# Patient Record
Sex: Female | Born: 1945
Health system: Southern US, Community
[De-identification: ages and names within clinical notes are randomized; demographics above are authoritative.]

## PROBLEM LIST (undated history)

## (undated) DIAGNOSIS — L650 Telogen effluvium: Secondary | ICD-10-CM

## (undated) DIAGNOSIS — R7301 Impaired fasting glucose: Secondary | ICD-10-CM

## (undated) DIAGNOSIS — I129 Hypertensive chronic kidney disease with stage 1 through stage 4 chronic kidney disease, or unspecified chronic kidney disease: Secondary | ICD-10-CM

## (undated) DIAGNOSIS — R7303 Prediabetes: Secondary | ICD-10-CM

## (undated) DIAGNOSIS — E782 Mixed hyperlipidemia: Secondary | ICD-10-CM

## (undated) DIAGNOSIS — R0600 Dyspnea, unspecified: Secondary | ICD-10-CM

## (undated) DIAGNOSIS — Z87442 Personal history of urinary calculi: Secondary | ICD-10-CM

## (undated) DIAGNOSIS — I5033 Acute on chronic diastolic (congestive) heart failure: Secondary | ICD-10-CM

## (undated) DIAGNOSIS — M1990 Unspecified osteoarthritis, unspecified site: Secondary | ICD-10-CM

## (undated) DIAGNOSIS — L658 Other specified nonscarring hair loss: Secondary | ICD-10-CM

## (undated) DIAGNOSIS — G4730 Sleep apnea, unspecified: Secondary | ICD-10-CM

## (undated) DIAGNOSIS — J302 Other seasonal allergic rhinitis: Secondary | ICD-10-CM

## (undated) DIAGNOSIS — I10 Essential (primary) hypertension: Secondary | ICD-10-CM

## (undated) DIAGNOSIS — R600 Localized edema: Secondary | ICD-10-CM

## (undated) HISTORY — DX: Localized edema: R60.0

## (undated) HISTORY — DX: Telogen effluvium: L65.0

## (undated) HISTORY — DX: Other seasonal allergic rhinitis: J30.2

## (undated) HISTORY — PX: TONSILLECTOMY: SUR1361

## (undated) HISTORY — DX: Essential (primary) hypertension: I10

## (undated) HISTORY — DX: Morbid (severe) obesity due to excess calories: E66.01

## (undated) HISTORY — DX: Other specified nonscarring hair loss: L65.8

## (undated) HISTORY — DX: Impaired fasting glucose: R73.01

## (undated) HISTORY — DX: Mixed hyperlipidemia: E78.2

## (undated) HISTORY — DX: Hypertensive chronic kidney disease with stage 1 through stage 4 chronic kidney disease, or unspecified chronic kidney disease: I12.9

## (undated) HISTORY — DX: Acute on chronic diastolic (congestive) heart failure: I50.33

---

## 2008-05-23 ENCOUNTER — Emergency Department (HOSPITAL_COMMUNITY): Admission: EM | Admit: 2008-05-23 | Discharge: 2008-05-23 | Payer: Self-pay | Admitting: Emergency Medicine

## 2011-06-15 LAB — POCT I-STAT, CHEM 8
Creatinine, Ser: 0.9
Glucose, Bld: 118 — ABNORMAL HIGH
Hemoglobin: 15
Potassium: 4.1
TCO2: 29

## 2011-06-15 LAB — URINALYSIS, ROUTINE W REFLEX MICROSCOPIC
Bilirubin Urine: NEGATIVE
Nitrite: NEGATIVE
Specific Gravity, Urine: 1.02
pH: 6

## 2011-06-15 LAB — CBC
HCT: 44.4
Hemoglobin: 14.8
MCV: 87.6
RBC: 5.07
WBC: 8.2

## 2011-06-15 LAB — DIFFERENTIAL
Eosinophils Absolute: 0
Eosinophils Relative: 0
Lymphs Abs: 1.3
Monocytes Relative: 4

## 2011-06-15 LAB — URINE MICROSCOPIC-ADD ON

## 2011-06-15 LAB — POCT CARDIAC MARKERS: Troponin i, poc: <0.05

## 2012-03-13 DIAGNOSIS — N200 Calculus of kidney: Secondary | ICD-10-CM | POA: Diagnosis not present

## 2012-03-13 DIAGNOSIS — I10 Essential (primary) hypertension: Secondary | ICD-10-CM | POA: Diagnosis not present

## 2012-06-21 DIAGNOSIS — L821 Other seborrheic keratosis: Secondary | ICD-10-CM | POA: Diagnosis not present

## 2012-06-21 DIAGNOSIS — L578 Other skin changes due to chronic exposure to nonionizing radiation: Secondary | ICD-10-CM | POA: Diagnosis not present

## 2012-06-21 DIAGNOSIS — L570 Actinic keratosis: Secondary | ICD-10-CM | POA: Diagnosis not present

## 2012-07-11 DIAGNOSIS — C44111 Basal cell carcinoma of skin of unspecified eyelid, including canthus: Secondary | ICD-10-CM | POA: Diagnosis not present

## 2013-08-22 DIAGNOSIS — D2330 Other benign neoplasm of skin of unspecified part of face: Secondary | ICD-10-CM | POA: Diagnosis not present

## 2013-08-22 DIAGNOSIS — L821 Other seborrheic keratosis: Secondary | ICD-10-CM | POA: Diagnosis not present

## 2014-11-24 DIAGNOSIS — M1811 Unilateral primary osteoarthritis of first carpometacarpal joint, right hand: Secondary | ICD-10-CM | POA: Diagnosis not present

## 2014-11-24 DIAGNOSIS — M79644 Pain in right finger(s): Secondary | ICD-10-CM | POA: Diagnosis not present

## 2015-03-25 LAB — HM PAP SMEAR

## 2015-04-03 LAB — HM DEXA SCAN

## 2015-04-27 DIAGNOSIS — N6001 Solitary cyst of right breast: Secondary | ICD-10-CM | POA: Diagnosis not present

## 2015-04-27 DIAGNOSIS — N63 Unspecified lump in breast: Secondary | ICD-10-CM | POA: Diagnosis not present

## 2015-06-03 DIAGNOSIS — M7050 Other bursitis of knee, unspecified knee: Secondary | ICD-10-CM | POA: Diagnosis not present

## 2016-01-08 DIAGNOSIS — M545 Low back pain: Secondary | ICD-10-CM | POA: Diagnosis not present

## 2016-01-08 DIAGNOSIS — M79661 Pain in right lower leg: Secondary | ICD-10-CM | POA: Diagnosis not present

## 2016-01-08 DIAGNOSIS — M25551 Pain in right hip: Secondary | ICD-10-CM | POA: Diagnosis not present

## 2016-02-16 DIAGNOSIS — M4316 Spondylolisthesis, lumbar region: Secondary | ICD-10-CM | POA: Diagnosis not present

## 2016-02-16 DIAGNOSIS — M545 Low back pain: Secondary | ICD-10-CM | POA: Diagnosis not present

## 2016-02-27 DIAGNOSIS — M4316 Spondylolisthesis, lumbar region: Secondary | ICD-10-CM | POA: Diagnosis not present

## 2016-03-03 DIAGNOSIS — M545 Low back pain: Secondary | ICD-10-CM | POA: Diagnosis not present

## 2016-03-03 DIAGNOSIS — M4316 Spondylolisthesis, lumbar region: Secondary | ICD-10-CM | POA: Diagnosis not present

## 2016-05-04 DIAGNOSIS — R21 Rash and other nonspecific skin eruption: Secondary | ICD-10-CM | POA: Diagnosis not present

## 2016-05-25 DIAGNOSIS — Z0000 Encounter for general adult medical examination without abnormal findings: Secondary | ICD-10-CM | POA: Diagnosis not present

## 2016-05-25 DIAGNOSIS — N959 Unspecified menopausal and perimenopausal disorder: Secondary | ICD-10-CM | POA: Diagnosis not present

## 2016-05-25 DIAGNOSIS — I10 Essential (primary) hypertension: Secondary | ICD-10-CM | POA: Diagnosis not present

## 2016-05-25 DIAGNOSIS — Z1239 Encounter for other screening for malignant neoplasm of breast: Secondary | ICD-10-CM | POA: Diagnosis not present

## 2016-05-25 DIAGNOSIS — Z23 Encounter for immunization: Secondary | ICD-10-CM | POA: Diagnosis not present

## 2016-05-25 DIAGNOSIS — R7301 Impaired fasting glucose: Secondary | ICD-10-CM | POA: Diagnosis not present

## 2016-06-02 DIAGNOSIS — Z1231 Encounter for screening mammogram for malignant neoplasm of breast: Secondary | ICD-10-CM | POA: Diagnosis not present

## 2016-06-08 DIAGNOSIS — R928 Other abnormal and inconclusive findings on diagnostic imaging of breast: Secondary | ICD-10-CM | POA: Diagnosis not present

## 2016-06-08 DIAGNOSIS — N6489 Other specified disorders of breast: Secondary | ICD-10-CM | POA: Diagnosis not present

## 2016-06-10 ENCOUNTER — Other Ambulatory Visit: Payer: Self-pay | Admitting: Family Medicine

## 2016-06-10 DIAGNOSIS — N6320 Unspecified lump in the left breast, unspecified quadrant: Secondary | ICD-10-CM

## 2016-06-10 DIAGNOSIS — R921 Mammographic calcification found on diagnostic imaging of breast: Secondary | ICD-10-CM

## 2016-06-14 ENCOUNTER — Ambulatory Visit
Admission: RE | Admit: 2016-06-14 | Discharge: 2016-06-14 | Disposition: A | Discharge status: Home / Self Care | Payer: Medicare Other | Source: Ambulatory Visit | Attending: Family Medicine | Admitting: Family Medicine

## 2016-06-14 ENCOUNTER — Other Ambulatory Visit: Payer: Self-pay | Admitting: Family Medicine

## 2016-06-14 DIAGNOSIS — N6320 Unspecified lump in the left breast, unspecified quadrant: Secondary | ICD-10-CM

## 2016-06-14 DIAGNOSIS — R921 Mammographic calcification found on diagnostic imaging of breast: Secondary | ICD-10-CM

## 2016-06-14 DIAGNOSIS — N6489 Other specified disorders of breast: Secondary | ICD-10-CM | POA: Diagnosis not present

## 2016-06-14 DIAGNOSIS — R928 Other abnormal and inconclusive findings on diagnostic imaging of breast: Secondary | ICD-10-CM | POA: Diagnosis not present

## 2016-06-14 DIAGNOSIS — R590 Localized enlarged lymph nodes: Secondary | ICD-10-CM | POA: Diagnosis not present

## 2016-12-28 ENCOUNTER — Other Ambulatory Visit: Payer: Self-pay | Admitting: Family Medicine

## 2016-12-28 DIAGNOSIS — N63 Unspecified lump in unspecified breast: Secondary | ICD-10-CM

## 2017-01-02 ENCOUNTER — Other Ambulatory Visit: Payer: Self-pay | Admitting: Family Medicine

## 2017-01-02 ENCOUNTER — Ambulatory Visit
Admission: RE | Admit: 2017-01-02 | Discharge: 2017-01-02 | Disposition: A | Discharge status: Home / Self Care | Payer: Medicare Other | Source: Ambulatory Visit | Attending: Family Medicine | Admitting: Family Medicine

## 2017-01-02 DIAGNOSIS — N63 Unspecified lump in unspecified breast: Secondary | ICD-10-CM

## 2017-01-02 DIAGNOSIS — N6320 Unspecified lump in the left breast, unspecified quadrant: Secondary | ICD-10-CM

## 2017-01-13 DIAGNOSIS — I10 Essential (primary) hypertension: Secondary | ICD-10-CM | POA: Diagnosis not present

## 2017-01-13 DIAGNOSIS — R7301 Impaired fasting glucose: Secondary | ICD-10-CM | POA: Diagnosis not present

## 2017-01-13 DIAGNOSIS — R600 Localized edema: Secondary | ICD-10-CM | POA: Diagnosis not present

## 2017-01-13 DIAGNOSIS — M545 Low back pain: Secondary | ICD-10-CM | POA: Diagnosis not present

## 2017-01-13 DIAGNOSIS — M79604 Pain in right leg: Secondary | ICD-10-CM | POA: Diagnosis not present

## 2017-01-26 DIAGNOSIS — I10 Essential (primary) hypertension: Secondary | ICD-10-CM | POA: Diagnosis not present

## 2017-01-26 DIAGNOSIS — R600 Localized edema: Secondary | ICD-10-CM | POA: Diagnosis not present

## 2017-04-19 DIAGNOSIS — L821 Other seborrheic keratosis: Secondary | ICD-10-CM | POA: Diagnosis not present

## 2017-04-28 ENCOUNTER — Other Ambulatory Visit: Payer: Self-pay | Admitting: Family Medicine

## 2017-04-28 ENCOUNTER — Other Ambulatory Visit: Payer: Self-pay

## 2017-04-28 DIAGNOSIS — Z1231 Encounter for screening mammogram for malignant neoplasm of breast: Secondary | ICD-10-CM

## 2017-05-23 DIAGNOSIS — H52223 Regular astigmatism, bilateral: Secondary | ICD-10-CM | POA: Diagnosis not present

## 2017-05-23 DIAGNOSIS — H25013 Cortical age-related cataract, bilateral: Secondary | ICD-10-CM | POA: Diagnosis not present

## 2017-06-06 DIAGNOSIS — Z0000 Encounter for general adult medical examination without abnormal findings: Secondary | ICD-10-CM | POA: Diagnosis not present

## 2017-06-06 DIAGNOSIS — Z6838 Body mass index (BMI) 38.0-38.9, adult: Secondary | ICD-10-CM | POA: Diagnosis not present

## 2017-06-12 ENCOUNTER — Ambulatory Visit
Admission: RE | Admit: 2017-06-12 | Discharge: 2017-06-12 | Disposition: A | Discharge status: Home / Self Care | Payer: Medicare Other | Source: Ambulatory Visit

## 2017-06-12 DIAGNOSIS — Z1231 Encounter for screening mammogram for malignant neoplasm of breast: Secondary | ICD-10-CM | POA: Diagnosis not present

## 2017-06-20 DIAGNOSIS — Z23 Encounter for immunization: Secondary | ICD-10-CM | POA: Diagnosis not present

## 2017-08-04 DIAGNOSIS — Z1211 Encounter for screening for malignant neoplasm of colon: Secondary | ICD-10-CM | POA: Diagnosis not present

## 2017-08-04 LAB — COLOGUARD: Cologuard: NEGATIVE

## 2017-08-10 LAB — COLOGUARD: Cologuard: NEGATIVE

## 2017-09-18 DIAGNOSIS — M545 Low back pain: Secondary | ICD-10-CM | POA: Diagnosis not present

## 2017-09-18 DIAGNOSIS — R600 Localized edema: Secondary | ICD-10-CM | POA: Diagnosis not present

## 2017-09-18 DIAGNOSIS — R7301 Impaired fasting glucose: Secondary | ICD-10-CM | POA: Diagnosis not present

## 2017-09-18 DIAGNOSIS — I10 Essential (primary) hypertension: Secondary | ICD-10-CM | POA: Diagnosis not present

## 2017-11-20 DIAGNOSIS — H25013 Cortical age-related cataract, bilateral: Secondary | ICD-10-CM | POA: Diagnosis not present

## 2017-12-04 DIAGNOSIS — H2511 Age-related nuclear cataract, right eye: Secondary | ICD-10-CM | POA: Diagnosis not present

## 2017-12-04 DIAGNOSIS — H40033 Anatomical narrow angle, bilateral: Secondary | ICD-10-CM | POA: Diagnosis not present

## 2017-12-04 DIAGNOSIS — H25811 Combined forms of age-related cataract, right eye: Secondary | ICD-10-CM | POA: Diagnosis not present

## 2017-12-04 DIAGNOSIS — Z01818 Encounter for other preprocedural examination: Secondary | ICD-10-CM | POA: Diagnosis not present

## 2017-12-04 DIAGNOSIS — E119 Type 2 diabetes mellitus without complications: Secondary | ICD-10-CM | POA: Diagnosis not present

## 2017-12-13 DIAGNOSIS — H25812 Combined forms of age-related cataract, left eye: Secondary | ICD-10-CM | POA: Diagnosis not present

## 2017-12-13 DIAGNOSIS — H2512 Age-related nuclear cataract, left eye: Secondary | ICD-10-CM | POA: Diagnosis not present

## 2017-12-21 DIAGNOSIS — R7301 Impaired fasting glucose: Secondary | ICD-10-CM | POA: Diagnosis not present

## 2017-12-21 DIAGNOSIS — E6609 Other obesity due to excess calories: Secondary | ICD-10-CM | POA: Diagnosis not present

## 2017-12-21 DIAGNOSIS — L658 Other specified nonscarring hair loss: Secondary | ICD-10-CM | POA: Diagnosis not present

## 2017-12-21 DIAGNOSIS — I10 Essential (primary) hypertension: Secondary | ICD-10-CM | POA: Diagnosis not present

## 2017-12-21 DIAGNOSIS — R600 Localized edema: Secondary | ICD-10-CM | POA: Diagnosis not present

## 2017-12-21 DIAGNOSIS — Z6838 Body mass index (BMI) 38.0-38.9, adult: Secondary | ICD-10-CM | POA: Diagnosis not present

## 2018-01-04 DIAGNOSIS — R944 Abnormal results of kidney function studies: Secondary | ICD-10-CM | POA: Diagnosis not present

## 2018-01-09 DIAGNOSIS — I10 Essential (primary) hypertension: Secondary | ICD-10-CM | POA: Diagnosis not present

## 2018-03-01 DIAGNOSIS — R600 Localized edema: Secondary | ICD-10-CM | POA: Diagnosis not present

## 2018-03-01 DIAGNOSIS — I5033 Acute on chronic diastolic (congestive) heart failure: Secondary | ICD-10-CM | POA: Diagnosis not present

## 2018-03-01 DIAGNOSIS — R0602 Shortness of breath: Secondary | ICD-10-CM | POA: Diagnosis not present

## 2018-03-01 DIAGNOSIS — R7301 Impaired fasting glucose: Secondary | ICD-10-CM | POA: Diagnosis not present

## 2018-03-03 DIAGNOSIS — R808 Other proteinuria: Secondary | ICD-10-CM | POA: Diagnosis not present

## 2018-03-05 DIAGNOSIS — R808 Other proteinuria: Secondary | ICD-10-CM | POA: Diagnosis not present

## 2018-03-08 DIAGNOSIS — I5033 Acute on chronic diastolic (congestive) heart failure: Secondary | ICD-10-CM | POA: Diagnosis not present

## 2018-03-23 DIAGNOSIS — R7301 Impaired fasting glucose: Secondary | ICD-10-CM | POA: Diagnosis not present

## 2018-03-23 DIAGNOSIS — R600 Localized edema: Secondary | ICD-10-CM | POA: Diagnosis not present

## 2018-03-23 DIAGNOSIS — L658 Other specified nonscarring hair loss: Secondary | ICD-10-CM | POA: Diagnosis not present

## 2018-03-23 DIAGNOSIS — Z6838 Body mass index (BMI) 38.0-38.9, adult: Secondary | ICD-10-CM | POA: Diagnosis not present

## 2018-03-23 DIAGNOSIS — I10 Essential (primary) hypertension: Secondary | ICD-10-CM | POA: Diagnosis not present

## 2018-05-07 DIAGNOSIS — N200 Calculus of kidney: Secondary | ICD-10-CM | POA: Diagnosis not present

## 2018-05-07 DIAGNOSIS — I129 Hypertensive chronic kidney disease with stage 1 through stage 4 chronic kidney disease, or unspecified chronic kidney disease: Secondary | ICD-10-CM | POA: Diagnosis not present

## 2018-05-07 DIAGNOSIS — D631 Anemia in chronic kidney disease: Secondary | ICD-10-CM | POA: Diagnosis not present

## 2018-05-07 DIAGNOSIS — N183 Chronic kidney disease, stage 3 (moderate): Secondary | ICD-10-CM | POA: Diagnosis not present

## 2018-05-10 ENCOUNTER — Other Ambulatory Visit: Payer: Self-pay | Admitting: Nephrology

## 2018-05-10 DIAGNOSIS — I129 Hypertensive chronic kidney disease with stage 1 through stage 4 chronic kidney disease, or unspecified chronic kidney disease: Secondary | ICD-10-CM

## 2018-05-10 DIAGNOSIS — N183 Chronic kidney disease, stage 3 unspecified: Secondary | ICD-10-CM

## 2018-05-10 DIAGNOSIS — N200 Calculus of kidney: Secondary | ICD-10-CM

## 2018-05-17 ENCOUNTER — Ambulatory Visit
Admission: RE | Admit: 2018-05-17 | Discharge: 2018-05-17 | Disposition: A | Discharge status: Home / Self Care | Payer: Medicare Other | Source: Ambulatory Visit | Attending: Nephrology | Admitting: Nephrology

## 2018-05-17 DIAGNOSIS — N183 Chronic kidney disease, stage 3 unspecified: Secondary | ICD-10-CM

## 2018-05-17 DIAGNOSIS — N200 Calculus of kidney: Secondary | ICD-10-CM

## 2018-05-17 DIAGNOSIS — I129 Hypertensive chronic kidney disease with stage 1 through stage 4 chronic kidney disease, or unspecified chronic kidney disease: Secondary | ICD-10-CM

## 2018-06-26 DIAGNOSIS — N183 Chronic kidney disease, stage 3 (moderate): Secondary | ICD-10-CM | POA: Diagnosis not present

## 2018-06-27 DIAGNOSIS — Z23 Encounter for immunization: Secondary | ICD-10-CM | POA: Diagnosis not present

## 2018-07-03 DIAGNOSIS — R809 Proteinuria, unspecified: Secondary | ICD-10-CM | POA: Diagnosis not present

## 2018-07-03 DIAGNOSIS — N183 Chronic kidney disease, stage 3 (moderate): Secondary | ICD-10-CM | POA: Diagnosis not present

## 2018-07-03 DIAGNOSIS — I129 Hypertensive chronic kidney disease with stage 1 through stage 4 chronic kidney disease, or unspecified chronic kidney disease: Secondary | ICD-10-CM | POA: Diagnosis not present

## 2018-07-16 DIAGNOSIS — H4303 Vitreous prolapse, bilateral: Secondary | ICD-10-CM | POA: Diagnosis not present

## 2018-08-01 ENCOUNTER — Other Ambulatory Visit: Payer: Self-pay | Admitting: Family Medicine

## 2018-08-01 DIAGNOSIS — Z1231 Encounter for screening mammogram for malignant neoplasm of breast: Secondary | ICD-10-CM

## 2018-08-07 DIAGNOSIS — N183 Chronic kidney disease, stage 3 (moderate): Secondary | ICD-10-CM | POA: Diagnosis not present

## 2018-09-17 ENCOUNTER — Ambulatory Visit
Admission: RE | Admit: 2018-09-17 | Discharge: 2018-09-17 | Disposition: A | Discharge status: Home / Self Care | Payer: Medicare Other | Source: Ambulatory Visit | Attending: Family Medicine | Admitting: Family Medicine

## 2018-09-17 DIAGNOSIS — Z1231 Encounter for screening mammogram for malignant neoplasm of breast: Secondary | ICD-10-CM | POA: Diagnosis not present

## 2018-09-17 LAB — HM MAMMOGRAPHY: HM Mammogram: ABNORMAL — AB (ref 0–4)

## 2018-09-19 ENCOUNTER — Other Ambulatory Visit: Payer: Self-pay | Admitting: Family Medicine

## 2018-09-19 DIAGNOSIS — R928 Other abnormal and inconclusive findings on diagnostic imaging of breast: Secondary | ICD-10-CM

## 2018-09-19 DIAGNOSIS — N183 Chronic kidney disease, stage 3 (moderate): Secondary | ICD-10-CM | POA: Diagnosis not present

## 2018-09-19 DIAGNOSIS — R600 Localized edema: Secondary | ICD-10-CM | POA: Diagnosis not present

## 2018-09-19 DIAGNOSIS — I129 Hypertensive chronic kidney disease with stage 1 through stage 4 chronic kidney disease, or unspecified chronic kidney disease: Secondary | ICD-10-CM | POA: Diagnosis not present

## 2018-09-19 DIAGNOSIS — E876 Hypokalemia: Secondary | ICD-10-CM | POA: Diagnosis not present

## 2018-09-25 ENCOUNTER — Ambulatory Visit
Admission: RE | Admit: 2018-09-25 | Discharge: 2018-09-25 | Disposition: A | Discharge status: Home / Self Care | Payer: Medicare Other | Source: Ambulatory Visit | Attending: Family Medicine | Admitting: Family Medicine

## 2018-09-25 DIAGNOSIS — R922 Inconclusive mammogram: Secondary | ICD-10-CM | POA: Diagnosis not present

## 2018-09-25 DIAGNOSIS — R928 Other abnormal and inconclusive findings on diagnostic imaging of breast: Secondary | ICD-10-CM

## 2018-09-25 DIAGNOSIS — N6002 Solitary cyst of left breast: Secondary | ICD-10-CM | POA: Diagnosis not present

## 2018-11-21 DIAGNOSIS — Z0001 Encounter for general adult medical examination with abnormal findings: Secondary | ICD-10-CM | POA: Diagnosis not present

## 2018-11-21 DIAGNOSIS — R7301 Impaired fasting glucose: Secondary | ICD-10-CM | POA: Diagnosis not present

## 2018-11-21 DIAGNOSIS — Z6841 Body Mass Index (BMI) 40.0 and over, adult: Secondary | ICD-10-CM | POA: Diagnosis not present

## 2018-11-21 DIAGNOSIS — I129 Hypertensive chronic kidney disease with stage 1 through stage 4 chronic kidney disease, or unspecified chronic kidney disease: Secondary | ICD-10-CM | POA: Diagnosis not present

## 2018-11-21 DIAGNOSIS — E782 Mixed hyperlipidemia: Secondary | ICD-10-CM | POA: Diagnosis not present

## 2018-11-21 DIAGNOSIS — D485 Neoplasm of uncertain behavior of skin: Secondary | ICD-10-CM | POA: Diagnosis not present

## 2018-12-05 DIAGNOSIS — C44319 Basal cell carcinoma of skin of other parts of face: Secondary | ICD-10-CM | POA: Diagnosis not present

## 2018-12-05 DIAGNOSIS — L820 Inflamed seborrheic keratosis: Secondary | ICD-10-CM | POA: Diagnosis not present

## 2018-12-05 DIAGNOSIS — L821 Other seborrheic keratosis: Secondary | ICD-10-CM | POA: Diagnosis not present

## 2019-01-14 DIAGNOSIS — N183 Chronic kidney disease, stage 3 (moderate): Secondary | ICD-10-CM | POA: Diagnosis not present

## 2019-01-21 DIAGNOSIS — I129 Hypertensive chronic kidney disease with stage 1 through stage 4 chronic kidney disease, or unspecified chronic kidney disease: Secondary | ICD-10-CM | POA: Diagnosis not present

## 2019-01-21 DIAGNOSIS — R809 Proteinuria, unspecified: Secondary | ICD-10-CM | POA: Diagnosis not present

## 2019-01-21 DIAGNOSIS — N183 Chronic kidney disease, stage 3 (moderate): Secondary | ICD-10-CM | POA: Diagnosis not present

## 2019-01-21 DIAGNOSIS — E876 Hypokalemia: Secondary | ICD-10-CM | POA: Diagnosis not present

## 2019-01-21 DIAGNOSIS — N200 Calculus of kidney: Secondary | ICD-10-CM | POA: Diagnosis not present

## 2019-01-21 DIAGNOSIS — R600 Localized edema: Secondary | ICD-10-CM | POA: Diagnosis not present

## 2019-03-02 IMAGING — MG DIGITAL SCREENING BILATERAL MAMMOGRAM WITH TOMO AND CAD
8 series · 8 of 24 positions shown · non-contrast
Comparison: Previous exam(s).

CLINICAL DATA: Screening.

EXAM:
DIGITAL SCREENING BILATERAL MAMMOGRAM WITH TOMO AND CAD

[R CC synth-2D]
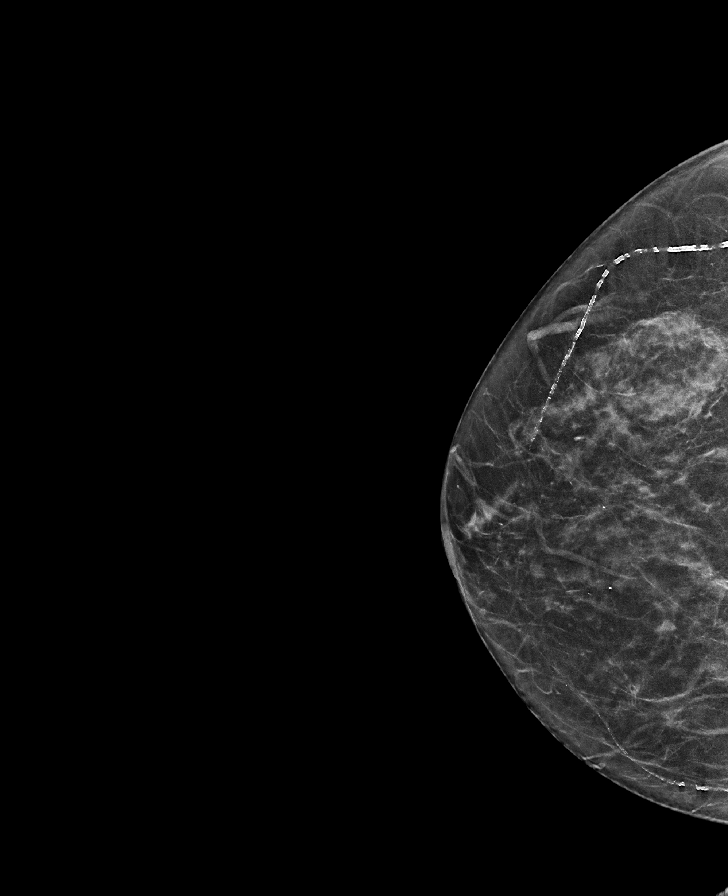

[L CC synth-2D]
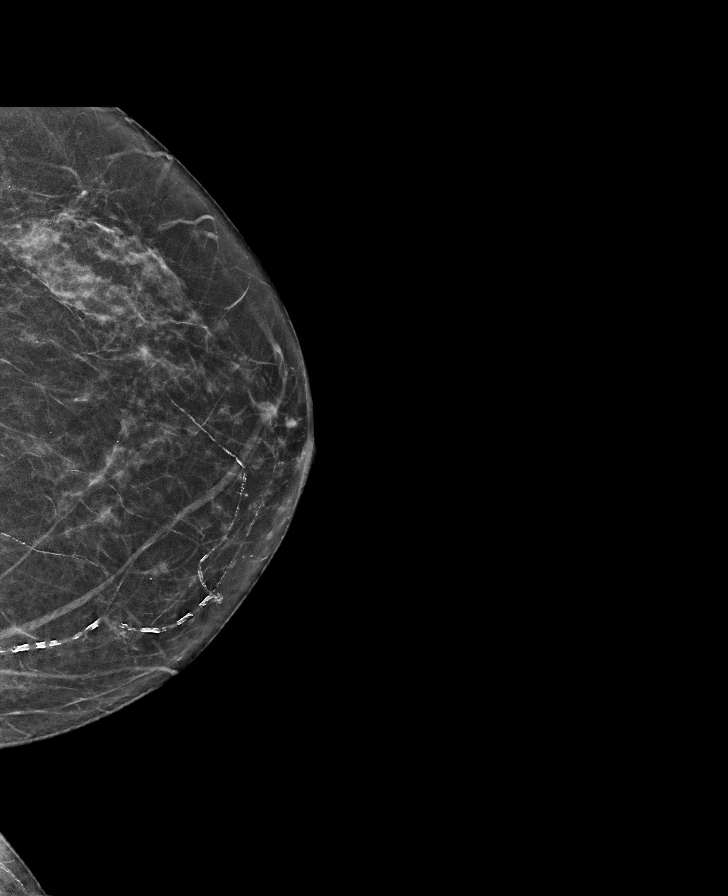

[L MLO synth-2D]
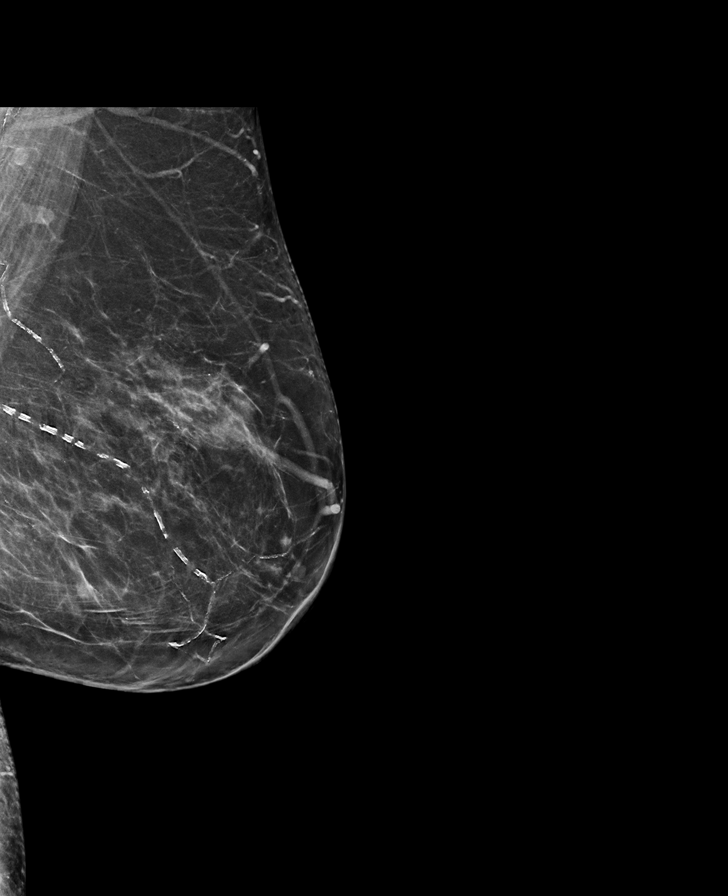

[R MLO synth-2D]
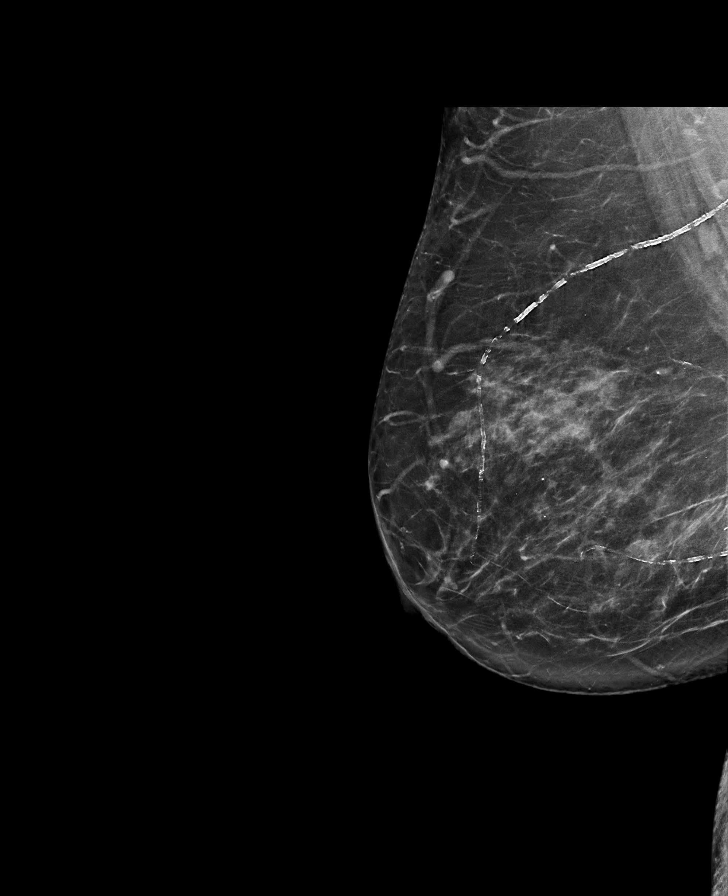

[R MLO tomo · tomo slice 39/77.0]
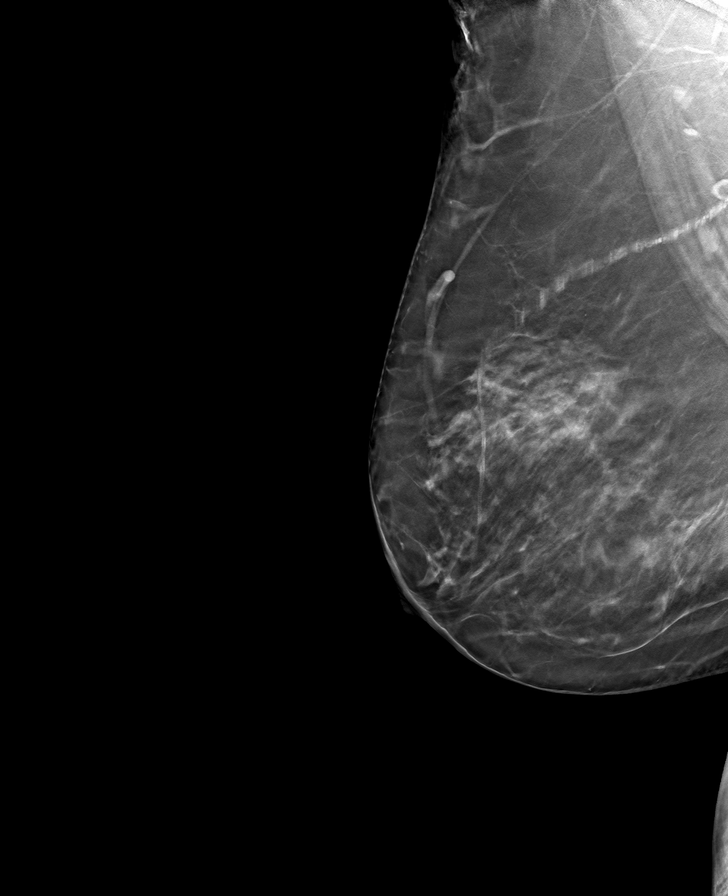

[L MLO tomo · tomo slice 39/78.0]
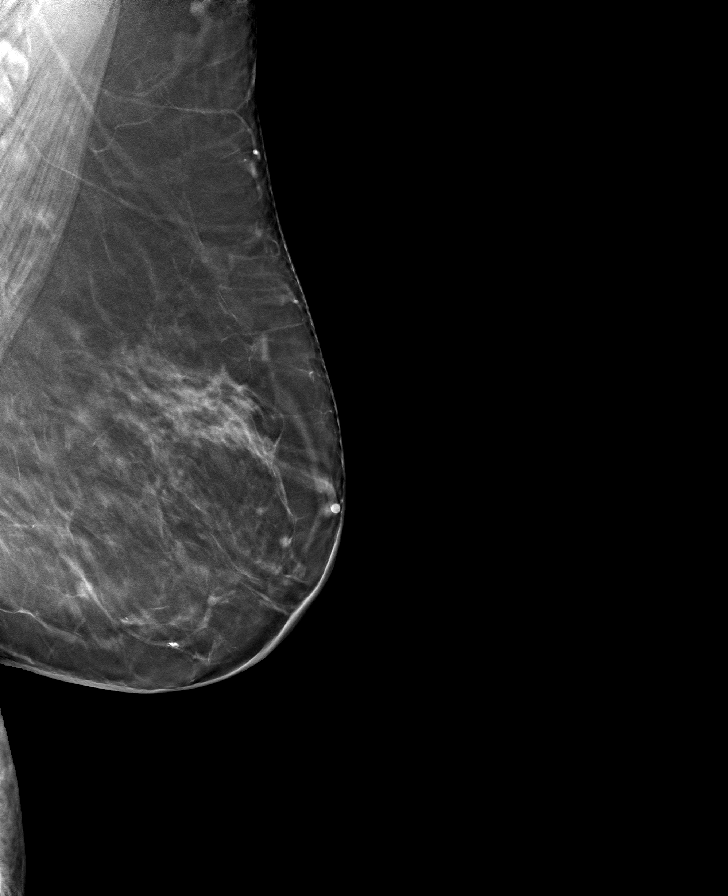

[R CC tomo · tomo slice 31/61.0]
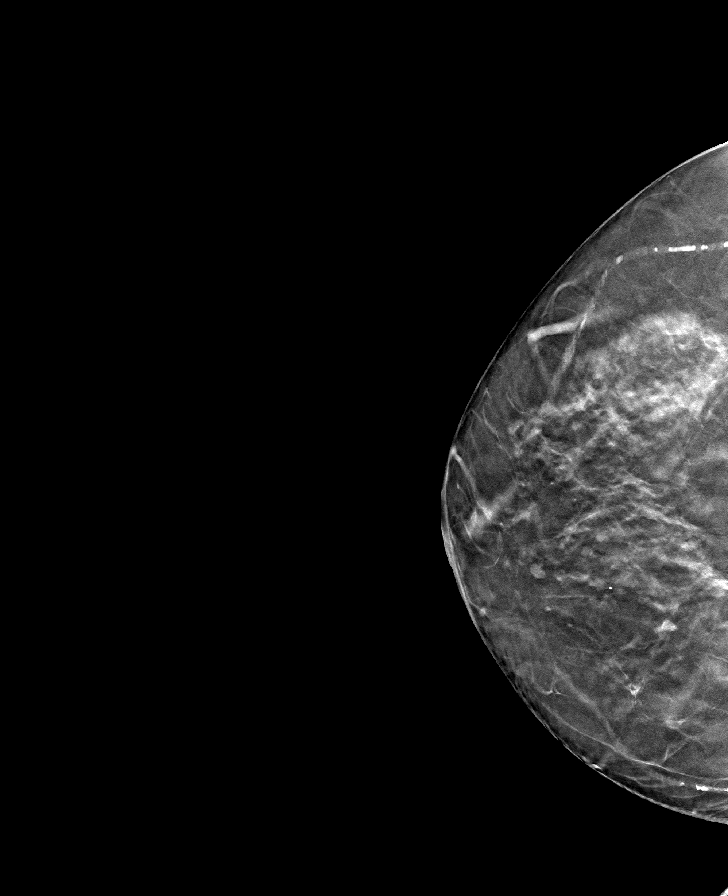

[L CC tomo · tomo slice 31/62.0]
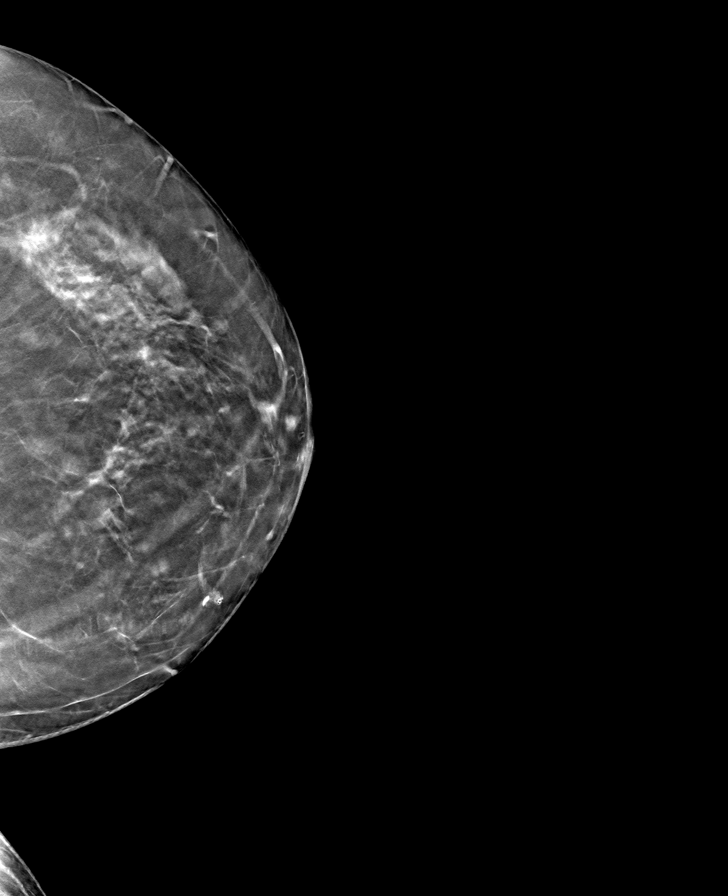

[8 of 24 positions shown; findings below may reference images not displayed]

ACR Breast Density Category c: The breast tissue is heterogeneously
dense, which may obscure small masses.
FINDINGS: In the right breast, a possible mass warrants further evaluation. In
the left breast, no findings suspicious for malignancy. Images were
processed with CAD.
IMPRESSION: Further evaluation is suggested for possible mass in the right
breast.

RECOMMENDATION:
Diagnostic mammogram and possibly ultrasound of the right breast.
(Code:[id number])

The patient will be contacted regarding the findings, and additional
imaging will be scheduled.

BI-RADS CATEGORY  0: Incomplete. Need additional imaging evaluation
and/or prior mammograms for comparison.

## 2019-04-17 DIAGNOSIS — I129 Hypertensive chronic kidney disease with stage 1 through stage 4 chronic kidney disease, or unspecified chronic kidney disease: Secondary | ICD-10-CM | POA: Diagnosis not present

## 2019-04-17 DIAGNOSIS — N183 Chronic kidney disease, stage 3 (moderate): Secondary | ICD-10-CM | POA: Diagnosis not present

## 2019-04-17 DIAGNOSIS — Z0120 Encounter for dental examination and cleaning without abnormal findings: Secondary | ICD-10-CM | POA: Diagnosis not present

## 2019-04-17 DIAGNOSIS — E876 Hypokalemia: Secondary | ICD-10-CM | POA: Diagnosis not present

## 2019-05-23 DIAGNOSIS — R600 Localized edema: Secondary | ICD-10-CM | POA: Diagnosis not present

## 2019-05-23 DIAGNOSIS — I10 Essential (primary) hypertension: Secondary | ICD-10-CM | POA: Diagnosis not present

## 2019-05-23 DIAGNOSIS — R7301 Impaired fasting glucose: Secondary | ICD-10-CM | POA: Diagnosis not present

## 2019-08-13 DIAGNOSIS — N183 Chronic kidney disease, stage 3 unspecified: Secondary | ICD-10-CM | POA: Diagnosis not present

## 2019-08-13 DIAGNOSIS — I129 Hypertensive chronic kidney disease with stage 1 through stage 4 chronic kidney disease, or unspecified chronic kidney disease: Secondary | ICD-10-CM | POA: Diagnosis not present

## 2019-08-13 DIAGNOSIS — D631 Anemia in chronic kidney disease: Secondary | ICD-10-CM | POA: Diagnosis not present

## 2019-08-13 DIAGNOSIS — E876 Hypokalemia: Secondary | ICD-10-CM | POA: Diagnosis not present

## 2019-08-13 DIAGNOSIS — N189 Chronic kidney disease, unspecified: Secondary | ICD-10-CM | POA: Diagnosis not present

## 2019-08-13 DIAGNOSIS — E559 Vitamin D deficiency, unspecified: Secondary | ICD-10-CM | POA: Diagnosis not present

## 2019-09-10 ENCOUNTER — Other Ambulatory Visit: Payer: Self-pay | Admitting: *Deleted

## 2019-09-10 DIAGNOSIS — Z1231 Encounter for screening mammogram for malignant neoplasm of breast: Secondary | ICD-10-CM

## 2019-10-20 ENCOUNTER — Other Ambulatory Visit: Payer: Self-pay | Admitting: Family Medicine

## 2019-10-21 ENCOUNTER — Other Ambulatory Visit: Payer: Self-pay

## 2019-10-21 ENCOUNTER — Ambulatory Visit
Admission: RE | Admit: 2019-10-21 | Discharge: 2019-10-21 | Disposition: A | Discharge status: Home / Self Care | Payer: Medicare Other | Source: Ambulatory Visit | Attending: Family Medicine | Admitting: Family Medicine

## 2019-10-21 DIAGNOSIS — Z1231 Encounter for screening mammogram for malignant neoplasm of breast: Secondary | ICD-10-CM

## 2019-10-22 DIAGNOSIS — Z0120 Encounter for dental examination and cleaning without abnormal findings: Secondary | ICD-10-CM | POA: Diagnosis not present

## 2019-10-23 ENCOUNTER — Telehealth: Payer: Self-pay

## 2019-11-07 NOTE — Telephone Encounter (Signed)
Made error.

## 2019-11-21 ENCOUNTER — Ambulatory Visit (INDEPENDENT_AMBULATORY_CARE_PROVIDER_SITE_OTHER): Payer: Medicare Other | Admitting: Family Medicine

## 2019-11-21 ENCOUNTER — Encounter: Payer: Self-pay | Admitting: Family Medicine

## 2019-11-21 ENCOUNTER — Other Ambulatory Visit: Payer: Self-pay

## 2019-11-21 VITALS — BP 110/50 | HR 80 | Temp 98.4°F | Resp 16 | Ht 61.0 in | Wt 221.0 lb

## 2019-11-21 DIAGNOSIS — E782 Mixed hyperlipidemia: Secondary | ICD-10-CM

## 2019-11-21 DIAGNOSIS — I10 Essential (primary) hypertension: Secondary | ICD-10-CM | POA: Insufficient documentation

## 2019-11-21 DIAGNOSIS — R7301 Impaired fasting glucose: Secondary | ICD-10-CM

## 2019-11-21 HISTORY — DX: Essential (primary) hypertension: I10

## 2019-11-21 NOTE — Progress Notes (Signed)
Subjective:  Patient ID: Lisa Ortega, female    DOB: 04-Aug-1946  Age: 74 y.o. MRN: [id number]  Chief Complaint  Patient presents with  . Hypertension  . Hyperlipidemia    HPI Patient is a 74 year old white female who presents for follow-up of hypertension, hyperlipidemia, and prediabetes.  She reports not eating healthy.  Nor is she exercising.  She does take her medications correctly.  She tolerates these well.   Social History   Socioeconomic History  . Marital status: Divorced    Spouse name: Not on file  . Number of children: 3  . Years of education: Not on file  . Highest education level: Not on file  Occupational History  . Occupation: Environmental health practitioner  Tobacco Use  . Smoking status: Never Smoker  . Smokeless tobacco: Never Used  Substance and Sexual Activity  . Alcohol use: Never  . Drug use: Never  . Sexual activity: Not on file  Other Topics Concern  . Not on file  Social History Narrative  . Not on file   Social Determinants of Health   Financial Resource Strain:   . Difficulty of Paying Living Expenses:   Food Insecurity:   . Worried About Charity fundraiser in the Last Year:   . Arboriculturist in the Last Year:   Transportation Needs:   . Film/video editor (Medical):   Marland Kitchen Lack of Transportation (Non-Medical):   Physical Activity:   . Days of Exercise per Week:   . Minutes of Exercise per Session:   Stress:   . Feeling of Stress :   Social Connections:   . Frequency of Communication with Friends and Family:   . Frequency of Social Gatherings with Friends and Family:   . Attends Religious Services:   . Active Member of Clubs or Organizations:   . Attends Archivist Meetings:   Marland Kitchen Marital Status:    Past Medical History:  Diagnosis Date  . Acute on chronic diastolic (congestive) heart failure ([place])   . Essential (primary) hypertension   . Hypertensive chronic kidney disease with stage 1 through stage 4 chronic kidney disease, or  unspecified chronic kidney disease   . Impaired fasting glucose   . Localized edema   . Mixed hyperlipidemia   . Morbid (severe) obesity due to excess calories ([place])   . Other seasonal allergic rhinitis   . Other specified nonscarring hair loss   . Telogen effluvium    Family History  Problem Relation Age of Onset  . Transient ischemic attack Mother   . Alzheimer's disease Mother   . Stroke Father   . Throat cancer Father   . Breast cancer Sister   . Pulmonary embolism Brother   . Asthma Other   . Diabetes type II Other   . Hypertension Other   . Hypothyroidism Other   . Heart attack Other   . Migraines Other   . Osteoarthritis Other   . Breast cancer Niece     Review of Systems  Constitutional: Negative for chills, fatigue and fever.  HENT: Negative for congestion, ear pain and sore throat.   Respiratory: Negative for cough and shortness of breath.   Cardiovascular: Negative for chest pain.  Gastrointestinal: Negative for abdominal pain, constipation, diarrhea, nausea and vomiting.  Endocrine: Negative for polydipsia, polyphagia and polyuria.  Genitourinary: Negative for dysuria and urgency.  Musculoskeletal: Positive for arthralgias. Negative for myalgias.  Neurological: Negative for dizziness and headaches.  Psychiatric/Behavioral: Negative for  dysphoric mood. The patient is not nervous/anxious.      Objective:  BP (!) 110/50   Pulse 80   Temp 98.4 F (36.9 C)   Resp 16   Ht 5\' 1"  (1.549 m)   Wt 221 lb (100.2 kg)   BMI 41.76 kg/m   BP/Weight 99991111  Systolic BP [id number]  Diastolic BP 50  Wt. (Lbs) 221  BMI 41.76    Physical Exam Vitals reviewed.  Constitutional:      Appearance: Normal appearance. She is obese.  Neck:     Vascular: No carotid bruit.  Cardiovascular:     Rate and Rhythm: Normal rate and regular rhythm.     Pulses: Normal pulses.     Heart sounds: Normal heart sounds.  Pulmonary:     Effort: Pulmonary effort is normal. No  respiratory distress.     Breath sounds: Normal breath sounds.  Abdominal:     General: Abdomen is flat. Bowel sounds are normal.     Palpations: Abdomen is soft.     Tenderness: There is no abdominal tenderness.  Neurological:     Mental Status: She is alert and oriented to person, place, and time.  Psychiatric:        Mood and Affect: Mood normal.        Behavior: Behavior normal.     Lab Results  Component Value Date   WBC 7.8 11/21/2019   HGB 12.3 11/21/2019   HCT 36.9 11/21/2019   PLT 210 11/21/2019   GLUCOSE 131 (H) 11/21/2019   CHOL 113 11/21/2019   TRIG 68 11/21/2019   HDL 50 11/21/2019   LDLCALC 49 11/21/2019   ALT 13 11/21/2019   AST 23 11/21/2019   NA 144 11/21/2019   K 4.7 11/21/2019   CL 103 11/21/2019   CREATININE 1.25 (H) 11/21/2019   BUN 17 11/21/2019   CO2 26 11/21/2019   TSH 3.270 11/21/2019   HGBA1C 6.3 (H) 11/21/2019      Assessment & Plan:  1. Essential hypertension, benign Well controlled.  The current medical regimen is effective;  continue present plan and medications. Recommend DASH diet.  - CBC with Differential/Platelet - Comprehensive metabolic panel - TSH  2. Mixed hyperlipidemia Fairly well controlled.  The current medical regimen is effective;  continue present plan and medications. - Lipid panel  3. Impaired fasting glucose Recommend diabetic diet and exercise. - Hemoglobin A1c  4. Morbid obesity with bmi 40-44. See above for edcuation.      Follow-up: Return in about 6 months (around 05/23/2020).  AVS was given to patient prior to departure.  Rochel Brome [facility] [phone]

## 2019-11-21 NOTE — Patient Instructions (Signed)
Medical problems stable. No changes to medicines.  Continue to work on eating a healthy diet and exercise.  Recommend 20 lb weight loss by the next visit.   Preventing Type 2 Diabetes Mellitus Type 2 diabetes (type 2 diabetes mellitus) is a long-term (chronic) disease that affects blood sugar (glucose) levels. Normally, a hormone called insulin allows glucose to enter cells in the body. The cells use glucose for energy. In type 2 diabetes, one or both of these problems may be present:  The body does not make enough insulin.  The body does not respond properly to insulin that it makes (insulin resistance). Insulin resistance or lack of insulin causes excess glucose to build up in the blood instead of going into cells. As a result, high blood glucose (hyperglycemia) develops, which can cause many complications. Being overweight or obese and having an inactive (sedentary) lifestyle can increase your risk for diabetes. Type 2 diabetes can be delayed or prevented by making certain nutrition and lifestyle changes. What nutrition changes can be made?   Eat healthy meals and snacks regularly. Keep a healthy snack with you for when you get hungry between meals, such as fruit or a handful of nuts.  Eat lean meats and proteins that are low in saturated fats, such as chicken, fish, egg whites, and beans. Avoid processed meats.  Eat plenty of fruits and vegetables and plenty of grains that have not been processed (whole grains). It is recommended that you eat: ? 1?2 cups of fruit every day. ? 2?3 cups of vegetables every day. ? 6?8 oz of whole grains every day, such as oats, whole wheat, bulgur, brown rice, quinoa, and millet.  Eat low-fat dairy products, such as milk, yogurt, and cheese.  Eat foods that contain healthy fats, such as nuts, avocado, olive oil, and canola oil.  Drink water throughout the day. Avoid drinks that contain added sugar, such as soda or sweet tea.  Follow instructions  from your health care provider about specific eating or drinking restrictions.  Control how much food you eat at a time (portion size). ? Check food labels to find out the serving sizes of foods. ? Use a kitchen scale to weigh amounts of foods.  Saute or steam food instead of frying it. Cook with water or broth instead of oils or butter.  Limit your intake of: ? Salt (sodium). Have no more than 1 tsp (2,400 mg) of sodium a day. If you have heart disease or high blood pressure, have less than ? tsp (1,500 mg) of sodium a day. ? Saturated fat. This is fat that is solid at room temperature, such as butter or fat on meat. What lifestyle changes can be made? Activity   Do moderate-intensity physical activity for at least 30 minutes on at least 5 days of the week, or as much as told by your health care provider.  Ask your health care provider what activities are safe for you. A mix of physical activities may be best, such as walking, swimming, cycling, and strength training.  Try to add physical activity into your day. For example: ? Park in spots that are farther away than usual, so that you walk more. For example, park in a far corner of the parking lot when you go to the office or the grocery store. ? Take a walk during your lunch break. ? Use stairs instead of elevators or escalators. Weight Loss  Lose weight as directed. Your health care provider can determine how much  weight loss is best for you and can help you lose weight safely.  If you are overweight or obese, you may be instructed to lose at least 5?7 % of your body weight. Alcohol and Tobacco   Limit alcohol intake to no more than 1 drink a day for nonpregnant women and 2 drinks a day for men. One drink equals 12 oz of beer, 5 oz of wine, or 1 oz of hard liquor.  Do not use any tobacco products, such as cigarettes, chewing tobacco, and e-cigarettes. If you need help quitting, ask your health care provider. Work With [place] Provider  Have your blood glucose tested regularly, as told by your health care provider.  Discuss your risk factors and how you can reduce your risk for diabetes.  Get screening tests as told by your health care provider. You may have screening tests regularly, especially if you have certain risk factors for type 2 diabetes.  Make an appointment with a diet and nutrition specialist (registered dietitian). A registered dietitian can help you make a healthy eating plan and can help you understand portion sizes and food labels. Why are these changes important?  It is possible to prevent or delay type 2 diabetes and related health problems by making lifestyle and nutrition changes.  It can be difficult to recognize signs of type 2 diabetes. The best way to avoid possible damage to your body is to take actions to prevent the disease before you develop symptoms. What can happen if changes are not made?  Your blood glucose levels may keep increasing. Having high blood glucose for a long time is dangerous. Too much glucose in your blood can damage your blood vessels, heart, kidneys, nerves, and eyes.  You may develop prediabetes or type 2 diabetes. Type 2 diabetes can lead to many chronic health problems and complications, such as: ? Heart disease. ? Stroke. ? Blindness. ? Kidney disease. ? Depression. ? Poor circulation in the feet and legs, which could lead to surgical removal (amputation) in severe cases. Where to find support  Ask your health care provider to recommend a registered dietitian, diabetes educator, or weight loss program.  Look for local or online weight loss groups.  Join a gym, fitness club, or outdoor activity group, such as a walking club. Where to find more information To learn more about diabetes and diabetes prevention, visit:  American Diabetes Association (ADA): www.diabetes.[employer] of Diabetes and Digestive and Kidney Diseases:  FindSpin.nl To learn more about healthy eating, visit:  The U.S. Department of Agriculture Scientist, research (physical sciences)), Choose My Plate: http://wiley-williams.com/  Office of Disease Prevention and Health Promotion (ODPHP), Dietary Guidelines: SurferLive.at Summary  You can reduce your risk for type 2 diabetes by increasing your physical activity, eating healthy foods, and losing weight as directed.  Talk with your health care provider about your risk for type 2 diabetes. Ask about any blood tests or screening tests that you need to have. This information is not intended to replace advice given to you by your health care provider. Make sure you discuss any questions you have with your health care provider. Document Revised: 12/21/2018 Document Reviewed: 10/20/2015 Elsevier Patient Education  [place] DASH stands for "Dietary Approaches to Stop Hypertension." The DASH eating plan is a healthy eating plan that has been shown to reduce high blood pressure (hypertension). It may also reduce your risk for type 2 diabetes, heart disease, and stroke. The DASH eating  plan may also help with weight loss. What are tips for following this plan?  General guidelines  Avoid eating more than 2,300 mg (milligrams) of salt (sodium) a day. If you have hypertension, you may need to reduce your sodium intake to 1,500 mg a day.  Limit alcohol intake to no more than 1 drink a day for nonpregnant women and 2 drinks a day for men. One drink equals 12 oz of beer, 5 oz of wine, or 1 oz of hard liquor.  Work with your health care provider to maintain a healthy body weight or to lose weight. Ask what an ideal weight is for you.  Get at least 30 minutes of exercise that causes your heart to beat faster (aerobic exercise) most days of the week. Activities may include walking, swimming, or biking.  Work with your health care provider or diet and  nutrition specialist (dietitian) to adjust your eating plan to your individual calorie needs. Reading food labels   Check food labels for the amount of sodium per serving. Choose foods with less than 5 percent of the Daily Value of sodium. Generally, foods with less than 300 mg of sodium per serving fit into this eating plan.  To find whole grains, look for the word "whole" as the first word in the ingredient list. Shopping  Buy products labeled as "low-sodium" or "no salt added."  Buy fresh foods. Avoid canned foods and premade or frozen meals. Cooking  Avoid adding salt when cooking. Use salt-free seasonings or herbs instead of table salt or sea salt. Check with your health care provider or pharmacist before using salt substitutes.  Do not fry foods. Cook foods using healthy methods such as baking, boiling, grilling, and broiling instead.  Cook with heart-healthy oils, such as olive, canola, soybean, or sunflower oil. Meal planning  Eat a balanced diet that includes: ? 5 or more servings of fruits and vegetables each day. At each meal, try to fill half of your plate with fruits and vegetables. ? Up to 6-8 servings of whole grains each day. ? Less than 6 oz of lean meat, poultry, or fish each day. A 3-oz serving of meat is about the same size as a deck of cards. One egg equals 1 oz. ? 2 servings of low-fat dairy each day. ? A serving of nuts, seeds, or beans 5 times each week. ? Heart-healthy fats. Healthy fats called Omega-3 fatty acids are found in foods such as flaxseeds and coldwater fish, like sardines, salmon, and mackerel.  Limit how much you eat of the following: ? Canned or prepackaged foods. ? Food that is high in trans fat, such as fried foods. ? Food that is high in saturated fat, such as fatty meat. ? Sweets, desserts, sugary drinks, and other foods with added sugar. ? Full-fat dairy products.  Do not salt foods before eating.  Try to eat at least 2 vegetarian  meals each week.  Eat more home-cooked food and less restaurant, buffet, and fast food.  When eating at a restaurant, ask that your food be prepared with less salt or no salt, if possible. What foods are recommended? The items listed may not be a complete list. Talk with your dietitian about what dietary choices are best for you. Grains Whole-grain or whole-wheat bread. Whole-grain or whole-wheat pasta. Brown rice. [place]. Bulgur. Whole-grain and low-sodium cereals. Pita bread. Low-fat, low-sodium crackers. Whole-wheat flour tortillas. Vegetables Fresh or frozen vegetables (raw, steamed, roasted, or grilled). Low-sodium or reduced-sodium  tomato and vegetable juice. Low-sodium or reduced-sodium tomato sauce and tomato paste. Low-sodium or reduced-sodium canned vegetables. Fruits All fresh, dried, or frozen fruit. Canned fruit in natural juice (without added sugar). Meat and other protein foods Skinless chicken or [place]. Ground chicken or [place]. Pork with fat trimmed off. Fish and seafood. Egg whites. Dried beans, peas, or lentils. Unsalted nuts, nut butters, and seeds. Unsalted canned beans. Lean cuts of beef with fat trimmed off. Low-sodium, lean deli meat. Dairy Low-fat (1%) or fat-free (skim) milk. Fat-free, low-fat, or reduced-fat cheeses. Nonfat, low-sodium ricotta or cottage cheese. Low-fat or nonfat yogurt. Low-fat, low-sodium cheese. Fats and oils Soft margarine without trans fats. Vegetable oil. Low-fat, reduced-fat, or light mayonnaise and salad dressings (reduced-sodium). Canola, safflower, olive, soybean, and sunflower oils. Avocado. Seasoning and other foods Herbs. Spices. Seasoning mixes without salt. Unsalted popcorn and pretzels. Fat-free sweets. What foods are not recommended? The items listed may not be a complete list. Talk with your dietitian about what dietary choices are best for you. Grains Baked goods made with fat, such as croissants, muffins, or some  breads. Dry pasta or rice meal packs. Vegetables Creamed or fried vegetables. Vegetables in a cheese sauce. Regular canned vegetables (not low-sodium or reduced-sodium). Regular canned tomato sauce and paste (not low-sodium or reduced-sodium). Regular tomato and vegetable juice (not low-sodium or reduced-sodium). Angie Fava. Olives. Fruits Canned fruit in a light or heavy syrup. Fried fruit. Fruit in cream or butter sauce. Meat and other protein foods Fatty cuts of meat. Ribs. Fried meat. Berniece Salines. Sausage. Bologna and other processed lunch meats. Salami. Fatback. Hotdogs. Bratwurst. Salted nuts and seeds. Canned beans with added salt. Canned or smoked fish. Whole eggs or egg yolks. Chicken or [place] with skin. Dairy Whole or 2% milk, cream, and half-and-half. Whole or full-fat cream cheese. Whole-fat or sweetened yogurt. Full-fat cheese. Nondairy creamers. Whipped toppings. Processed cheese and cheese spreads. Fats and oils Butter. Stick margarine. Lard. Shortening. Ghee. Bacon fat. Tropical oils, such as coconut, palm kernel, or palm oil. Seasoning and other foods Salted popcorn and pretzels. Onion salt, garlic salt, seasoned salt, table salt, and sea salt. Worcestershire sauce. Tartar sauce. Barbecue sauce. Teriyaki sauce. Soy sauce, including reduced-sodium. Steak sauce. Canned and packaged gravies. Fish sauce. Oyster sauce. Cocktail sauce. Horseradish that you find on the shelf. Ketchup. Mustard. Meat flavorings and tenderizers. Bouillon cubes. Hot sauce and Tabasco sauce. Premade or packaged marinades. Premade or packaged taco seasonings. Relishes. Regular salad dressings. Where to find more information:  National Heart, Lung, and [place]: https://wilson-eaton.com/  American Heart Association: www.heart.org Summary  The DASH eating plan is a healthy eating plan that has been shown to reduce high blood pressure (hypertension). It may also reduce your risk for type 2 diabetes, heart disease, and  stroke.  With the DASH eating plan, you should limit salt (sodium) intake to 2,300 mg a day. If you have hypertension, you may need to reduce your sodium intake to 1,500 mg a day.  When on the DASH eating plan, aim to eat more fresh fruits and vegetables, whole grains, lean proteins, low-fat dairy, and heart-healthy fats.  Work with your health care provider or diet and nutrition specialist (dietitian) to adjust your eating plan to your individual calorie needs. This information is not intended to replace advice given to you by your health care provider. Make sure you discuss any questions you have with your health care provider. Document Revised: 08/11/2017 Document Reviewed: 08/22/2016 Elsevier Patient Education  2020 [employer].

## 2019-11-22 LAB — CBC WITH DIFFERENTIAL/PLATELET
Basophils Absolute: 0.1 10*3/uL (ref 0.0–0.2)
Basos: 1 %
EOS (ABSOLUTE): 0.1 10*3/uL (ref 0.0–0.4)
Eos: 2 %
Hematocrit: 36.9 % (ref 34.0–46.6)
Hemoglobin: 12.3 g/dL (ref 11.1–15.9)
Immature Grans (Abs): 0 10*3/uL (ref 0.0–0.1)
Immature Granulocytes: 0 %
Lymphocytes Absolute: 2.5 10*3/uL (ref 0.7–3.1)
Lymphs: 32 %
MCH: 29.7 pg (ref 26.6–33.0)
MCHC: 33.3 g/dL (ref 31.5–35.7)
MCV: 89 fL (ref 79–97)
Monocytes Absolute: 0.6 10*3/uL (ref 0.1–0.9)
Monocytes: 7 %
Neutrophils Absolute: 4.5 10*3/uL (ref 1.4–7.0)
Neutrophils: 58 %
Platelets: 210 10*3/uL (ref 150–450)
RBC: 4.14 x10E6/uL (ref 3.77–5.28)
RDW: 12.8 % (ref 11.7–15.4)
WBC: 7.8 10*3/uL (ref 3.4–10.8)

## 2019-11-22 LAB — HEMOGLOBIN A1C
Est. average glucose Bld gHb Est-mCnc: 134 mg/dL
Hgb A1c MFr Bld: 6.3 % — ABNORMAL HIGH (ref 4.8–5.6)

## 2019-11-22 LAB — LIPID PANEL
Chol/HDL Ratio: 2.3 ratio (ref 0.0–4.4)
Cholesterol, Total: 113 mg/dL (ref 100–199)
HDL: 50 mg/dL (ref >39)
LDL Chol Calc (NIH): 49 mg/dL (ref 0–99)
Triglycerides: 68 mg/dL (ref 0–149)
VLDL Cholesterol Cal: 14 mg/dL (ref 5–40)

## 2019-11-22 LAB — COMPREHENSIVE METABOLIC PANEL
ALT: 13 IU/L (ref 0–32)
AST: 23 IU/L (ref 0–40)
Albumin/Globulin Ratio: 1.3 (ref 1.2–2.2)
Albumin: 4.1 g/dL (ref 3.7–4.7)
Alkaline Phosphatase: 95 IU/L (ref 39–117)
BUN/Creatinine Ratio: 14 (ref 12–28)
BUN: 17 mg/dL (ref 8–27)
Bilirubin Total: 0.5 mg/dL (ref 0.0–1.2)
CO2: 26 mmol/L (ref 20–29)
Calcium: 9.2 mg/dL (ref 8.7–10.3)
Chloride: 103 mmol/L (ref 96–106)
Creatinine, Ser: 1.25 mg/dL — ABNORMAL HIGH (ref 0.57–1.00)
GFR calc Af Amer: 49 mL/min/{1.73_m2} — ABNORMAL LOW (ref >59)
GFR calc non Af Amer: 42 mL/min/{1.73_m2} — ABNORMAL LOW (ref >59)
Globulin, Total: 3.2 g/dL (ref 1.5–4.5)
Glucose: 131 mg/dL — ABNORMAL HIGH (ref 65–99)
Potassium: 4.7 mmol/L (ref 3.5–5.2)
Sodium: 144 mmol/L (ref 134–144)
Total Protein: 7.3 g/dL (ref 6.0–8.5)

## 2019-11-22 LAB — TSH: TSH: 3.27 u[IU]/mL (ref 0.450–4.500)

## 2019-11-22 LAB — CARDIOVASCULAR RISK ASSESSMENT

## 2019-11-23 DIAGNOSIS — Z6838 Body mass index (BMI) 38.0-38.9, adult: Secondary | ICD-10-CM | POA: Insufficient documentation

## 2019-11-23 DIAGNOSIS — E66813 Obesity, class 3: Secondary | ICD-10-CM

## 2019-11-23 HISTORY — DX: Obesity, class 3: E66.813

## 2019-11-23 HISTORY — DX: Morbid (severe) obesity due to excess calories: E66.01

## 2019-11-23 NOTE — Assessment & Plan Note (Signed)
Work on eating healthy and exercising.

## 2019-12-20 ENCOUNTER — Other Ambulatory Visit: Payer: Self-pay

## 2019-12-20 MED ORDER — ROSUVASTATIN CALCIUM 5 MG PO TABS: 5.0000 mg | ORAL_TABLET | Freq: Every day | ORAL | 0 refills | Status: DC

## 2019-12-29 ENCOUNTER — Other Ambulatory Visit: Payer: Self-pay | Admitting: Family Medicine

## 2020-01-21 DIAGNOSIS — M1712 Unilateral primary osteoarthritis, left knee: Secondary | ICD-10-CM | POA: Diagnosis not present

## 2020-01-21 DIAGNOSIS — M25562 Pain in left knee: Secondary | ICD-10-CM

## 2020-01-21 HISTORY — DX: Pain in left knee: M25.562

## 2020-01-28 DIAGNOSIS — M25562 Pain in left knee: Secondary | ICD-10-CM | POA: Diagnosis not present

## 2020-02-08 DIAGNOSIS — M25562 Pain in left knee: Secondary | ICD-10-CM | POA: Diagnosis not present

## 2020-02-13 DIAGNOSIS — M25562 Pain in left knee: Secondary | ICD-10-CM | POA: Diagnosis not present

## 2020-03-05 DIAGNOSIS — M545 Low back pain: Secondary | ICD-10-CM | POA: Diagnosis not present

## 2020-03-05 DIAGNOSIS — M25562 Pain in left knee: Secondary | ICD-10-CM | POA: Diagnosis not present

## 2020-03-23 ENCOUNTER — Other Ambulatory Visit: Payer: Self-pay

## 2020-03-23 MED ORDER — ROSUVASTATIN CALCIUM 5 MG PO TABS: 5.0000 mg | ORAL_TABLET | Freq: Every day | ORAL | 0 refills | Status: DC

## 2020-04-22 DIAGNOSIS — Z0120 Encounter for dental examination and cleaning without abnormal findings: Secondary | ICD-10-CM | POA: Diagnosis not present

## 2020-04-24 ENCOUNTER — Other Ambulatory Visit: Payer: Self-pay

## 2020-04-24 MED ORDER — LOSARTAN POTASSIUM 25 MG PO TABS: 25.0000 mg | ORAL_TABLET | Freq: Every day | ORAL | 1 refills | Status: DC

## 2020-05-04 DIAGNOSIS — N189 Chronic kidney disease, unspecified: Secondary | ICD-10-CM | POA: Diagnosis not present

## 2020-05-04 DIAGNOSIS — N183 Chronic kidney disease, stage 3 unspecified: Secondary | ICD-10-CM | POA: Diagnosis not present

## 2020-05-11 DIAGNOSIS — N1831 Chronic kidney disease, stage 3a: Secondary | ICD-10-CM | POA: Diagnosis not present

## 2020-05-11 DIAGNOSIS — E876 Hypokalemia: Secondary | ICD-10-CM | POA: Diagnosis not present

## 2020-05-11 DIAGNOSIS — I129 Hypertensive chronic kidney disease with stage 1 through stage 4 chronic kidney disease, or unspecified chronic kidney disease: Secondary | ICD-10-CM | POA: Diagnosis not present

## 2020-05-11 DIAGNOSIS — D631 Anemia in chronic kidney disease: Secondary | ICD-10-CM | POA: Diagnosis not present

## 2020-05-25 ENCOUNTER — Other Ambulatory Visit: Payer: Self-pay

## 2020-05-25 ENCOUNTER — Ambulatory Visit (INDEPENDENT_AMBULATORY_CARE_PROVIDER_SITE_OTHER): Payer: Medicare Other | Admitting: Family Medicine

## 2020-05-25 VITALS — BP 110/70 | HR 70 | Temp 97.3°F | Resp 16 | Ht 61.0 in | Wt 218.0 lb

## 2020-05-25 DIAGNOSIS — N1831 Chronic kidney disease, stage 3a: Secondary | ICD-10-CM

## 2020-05-25 DIAGNOSIS — E782 Mixed hyperlipidemia: Secondary | ICD-10-CM | POA: Diagnosis not present

## 2020-05-25 DIAGNOSIS — R7301 Impaired fasting glucose: Secondary | ICD-10-CM

## 2020-05-25 DIAGNOSIS — I129 Hypertensive chronic kidney disease with stage 1 through stage 4 chronic kidney disease, or unspecified chronic kidney disease: Secondary | ICD-10-CM

## 2020-05-25 NOTE — Progress Notes (Signed)
Subjective:  Patient ID: Lisa Ortega, female    DOB: Jan 15, 1946  Age: 74 y.o. MRN: [id number]  Chief Complaint  Patient presents with  . Hypertension  . Hyperlipidemia    Patient is a 74 year old white female who presents for follow-up of hypertension, hyperlipidemia, and prediabetes.  She reports not eating healthy.  Nor is she exercising.  She does take her medications correctly.  She tolerates these well.  Hyperlipidemia: Takes crestor 5 one daily at night.  Hypertension: Taking losartan, atenolol, and furosemide. Takes potassium chloride. Creatinine stable.  Vitamin D deficiency. Vitamin D level checked by nephrology and it was good. Recommended she remain on it, but not take extra calcium.  Social History   Socioeconomic History  . Marital status: Divorced    Spouse name: Not on file  . Number of children: 3  . Years of education: Not on file  . Highest education level: Not on file  Occupational History  . Occupation: Environmental health practitioner  Tobacco Use  . Smoking status: Never Smoker  . Smokeless tobacco: Never Used  Vaping Use  . Vaping Use: Never used  Substance and Sexual Activity  . Alcohol use: Never  . Drug use: Never  . Sexual activity: Not on file  Other Topics Concern  . Not on file  Social History Narrative  . Not on file   Social Determinants of Health   Financial Resource Strain:   . Difficulty of Paying Living Expenses: Not on file  Food Insecurity:   . Worried About Charity fundraiser in the Last Year: Not on file  . Ran Out of Food in the Last Year: Not on file  Transportation Needs:   . Lack of Transportation (Medical): Not on file  . Lack of Transportation (Non-Medical): Not on file  Physical Activity:   . Days of Exercise per Week: Not on file  . Minutes of Exercise per Session: Not on file  Stress:   . Feeling of Stress : Not on file  Social Connections:   . Frequency of Communication with Friends and Family: Not on file  . Frequency of  Social Gatherings with Friends and Family: Not on file  . Attends Religious Services: Not on file  . Active Member of Clubs or Organizations: Not on file  . Attends Archivist Meetings: Not on file  . Marital Status: Not on file   Past Medical History:  Diagnosis Date  . Acute on chronic diastolic (congestive) heart failure ([place])   . Essential (primary) hypertension   . Hypertensive chronic kidney disease with stage 1 through stage 4 chronic kidney disease, or unspecified chronic kidney disease   . Impaired fasting glucose   . Localized edema   . Mixed hyperlipidemia   . Morbid (severe) obesity due to excess calories ([place])   . Other seasonal allergic rhinitis   . Other specified nonscarring hair loss   . Telogen effluvium    Family History  Problem Relation Age of Onset  . Transient ischemic attack Mother   . Alzheimer's disease Mother   . Stroke Father   . Throat cancer Father   . Breast cancer Sister   . Pulmonary embolism Brother   . Asthma Other   . Diabetes type II Other   . Hypertension Other   . Hypothyroidism Other   . Heart attack Other   . Migraines Other   . Osteoarthritis Other   . Breast cancer Niece     Review of Systems  Constitutional: Negative for chills, fatigue and fever.  HENT: Negative for congestion, ear pain and sore throat.   Respiratory: Negative for cough and shortness of breath.   Cardiovascular: Negative for chest pain.  Gastrointestinal: Negative for abdominal pain, constipation, diarrhea, nausea and vomiting.  Endocrine: Negative for polydipsia, polyphagia and polyuria.  Genitourinary: Negative for dysuria and urgency.  Musculoskeletal: Positive for back pain (does not take anything for it. ). Negative for arthralgias and myalgias.  Neurological: Negative for dizziness and headaches.  Psychiatric/Behavioral: Negative for dysphoric mood. The patient is not nervous/anxious.      Objective:  BP 110/70   Pulse 70   Temp (!)  97.3 F (36.3 C)   Resp 16   Ht 5\' 1"  (1.549 m)   Wt 218 lb (98.9 kg)   BMI 41.19 kg/m   BP/Weight 05/25/2020 10/22/4707  Systolic BP 628 366  Diastolic BP 70 50  Wt. (Lbs) 218 221  BMI 41.19 41.76    Physical Exam Vitals reviewed.  Constitutional:      Appearance: Normal appearance. She is obese.  Neck:     Vascular: No carotid bruit.  Cardiovascular:     Rate and Rhythm: Normal rate and regular rhythm.     Pulses: Normal pulses.     Heart sounds: Normal heart sounds.  Pulmonary:     Effort: Pulmonary effort is normal. No respiratory distress.     Breath sounds: Normal breath sounds.  Abdominal:     General: Abdomen is flat. Bowel sounds are normal.     Palpations: Abdomen is soft.     Tenderness: There is no abdominal tenderness.  Neurological:     Mental Status: She is alert and oriented to person, place, and time.  Psychiatric:        Mood and Affect: Mood normal.        Behavior: Behavior normal.     Lab Results  Component Value Date   WBC 7.8 11/21/2019   HGB 12.3 11/21/2019   HCT 36.9 11/21/2019   PLT 210 11/21/2019   GLUCOSE 131 (H) 11/21/2019   CHOL 113 11/21/2019   TRIG 68 11/21/2019   HDL 50 11/21/2019   LDLCALC 49 11/21/2019   ALT 13 11/21/2019   AST 23 11/21/2019   NA 144 11/21/2019   K 4.7 11/21/2019   CL 103 11/21/2019   CREATININE 1.25 (H) 11/21/2019   BUN 17 11/21/2019   CO2 26 11/21/2019   TSH 3.270 11/21/2019   HGBA1C 6.3 (H) 11/21/2019      Assessment & Plan:  1. Essential hypertension, benign with CKD N18.3a Well controlled.  Keep annual follow up with kidney doctors. The current medical regimen is effective;  continue present plan and medications. Recommend DASH diet.  - CBC with Differential/Platelet - Comprehensive metabolic panel - TSH  2. Mixed hyperlipidemia Well controlled.  The current medical regimen is effective;  continue present plan and medications. - Lipid panel  3. Impaired fasting glucose Recommend  diabetic diet and exercise. - Hemoglobin A1c  4. Morbid obesity with bmi 40-44. See above for edcuation. Recommend continue to work on eating healthy diet and exercise.    I strongly recommend the covid 19 vaccinations (pfizer or moderna.)  Please also continue to practice social distancing, mask wearing, and hand washing. Please call if you develop symptoms of COVID (fever, cough, shortness of breath, congestion, aches, loss of taset/smell, headaches) regardless of vaccination status..  Follow-up: Return in about 6 months (around 11/22/2020) for fasting.  AVS was given to patient prior to departure.  Rochel Brome [facility] [phone]

## 2020-05-25 NOTE — Patient Instructions (Addendum)
I strongly recommend the covid 19 vaccinations (pfizer or moderna.)  Please also continue to practice social distancing, mask wearing, and hand washing.   Please call if you develop symptoms of COVID (fever, cough, shortness of breath, congestion, aches, loss of taset/smell, headaches) regardless of vaccination status, so we can schedule a virtual visit and treat you for covid 19.  Diabetes Mellitus and Nutrition, Adult When you have diabetes (diabetes mellitus), it is very important to have healthy eating habits because your blood sugar (glucose) levels are greatly affected by what you eat and drink. Eating healthy foods in the appropriate amounts, at about the same times every day, can help you:  Control your blood glucose.  Lower your risk of heart disease.  Improve your blood pressure.  Reach or maintain a healthy weight. Every person with diabetes is different, and each person has different needs for a meal plan. Your health care provider may recommend that you work with a diet and nutrition specialist (dietitian) to make a meal plan that is best for you. Your meal plan may vary depending on factors such as:  The calories you need.  The medicines you take.  Your weight.  Your blood glucose, blood pressure, and cholesterol levels.  Your activity level.  Other health conditions you have, such as heart or kidney disease. How do carbohydrates affect me? Carbohydrates, also called carbs, affect your blood glucose level more than any other type of food. Eating carbs naturally raises the amount of glucose in your blood. Carb counting is a method for keeping track of how many carbs you eat. Counting carbs is important to keep your blood glucose at a healthy level, especially if you use insulin or take certain oral diabetes medicines. It is important to know how many carbs you can safely have in each meal. This is different for every person. Your dietitian can help you calculate how many  carbs you should have at each meal and for each snack. Foods that contain carbs include:  Bread, cereal, rice, pasta, and crackers.  Potatoes and corn.  Peas, beans, and lentils.  Milk and yogurt.  Fruit and juice.  Desserts, such as cakes, cookies, ice cream, and candy. How does alcohol affect me? Alcohol can cause a sudden decrease in blood glucose (hypoglycemia), especially if you use insulin or take certain oral diabetes medicines. Hypoglycemia can be a life-threatening condition. Symptoms of hypoglycemia (sleepiness, dizziness, and confusion) are similar to symptoms of having too much alcohol. If your health care provider says that alcohol is safe for you, follow these guidelines:  Limit alcohol intake to no more than 1 drink per day for nonpregnant women and 2 drinks per day for men. One drink equals 12 oz of beer, 5 oz of wine, or 1 oz of hard liquor.  Do not drink on an empty stomach.  Keep yourself hydrated with water, diet soda, or unsweetened iced tea.  Keep in mind that regular soda, juice, and other mixers may contain a lot of sugar and must be counted as carbs. What are tips for following this plan?  Reading food labels  Start by checking the serving size on the "Nutrition Facts" label of packaged foods and drinks. The amount of calories, carbs, fats, and other nutrients listed on the label is based on one serving of the item. Many items contain more than one serving per package.  Check the total grams (g) of carbs in one serving. You can calculate the number of servings of  carbs in one serving by dividing the total carbs by 15. For example, if a food has 30 g of total carbs, it would be equal to 2 servings of carbs.  Check the number of grams (g) of saturated and trans fats in one serving. Choose foods that have low or no amount of these fats.  Check the number of milligrams (mg) of salt (sodium) in one serving. Most people should limit total sodium intake to less  than 2,300 mg per day.  Always check the nutrition information of foods labeled as "low-fat" or "nonfat". These foods may be higher in added sugar or refined carbs and should be avoided.  Talk to your dietitian to identify your daily goals for nutrients listed on the label. Shopping  Avoid buying canned, premade, or processed foods. These foods tend to be high in fat, sodium, and added sugar.  Shop around the outside edge of the grocery store. This includes fresh fruits and vegetables, bulk grains, fresh meats, and fresh dairy. Cooking  Use low-heat cooking methods, such as baking, instead of high-heat cooking methods like deep frying.  Cook using healthy oils, such as olive, canola, or sunflower oil.  Avoid cooking with butter, cream, or high-fat meats. Meal planning  Eat meals and snacks regularly, preferably at the same times every day. Avoid going long periods of time without eating.  Eat foods high in fiber, such as fresh fruits, vegetables, beans, and whole grains. Talk to your dietitian about how many servings of carbs you can eat at each meal.  Eat 4-6 ounces (oz) of lean protein each day, such as lean meat, chicken, fish, eggs, or tofu. One oz of lean protein is equal to: ? 1 oz of meat, chicken, or fish. ? 1 egg. ?  cup of tofu.  Eat some foods each day that contain healthy fats, such as avocado, nuts, seeds, and fish. Lifestyle  Check your blood glucose regularly.  Exercise regularly as told by your health care provider. This may include: ? 150 minutes of moderate-intensity or vigorous-intensity exercise each week. This could be brisk walking, biking, or water aerobics. ? Stretching and doing strength exercises, such as yoga or weightlifting, at least 2 times a week.  Take medicines as told by your health care provider.  Do not use any products that contain nicotine or tobacco, such as cigarettes and e-cigarettes. If you need help quitting, ask your health care  provider.  Work with a Social worker or diabetes educator to identify strategies to manage stress and any emotional and social challenges. Questions to ask a health care provider  Do I need to meet with a diabetes educator?  Do I need to meet with a dietitian?  What number can I call if I have questions?  When are the best times to check my blood glucose? Where to find more information:  American Diabetes Association: diabetes.org  Academy of Nutrition and Dietetics: www.eatright.[employer] of Diabetes and Digestive and Kidney Diseases (NIH): [employer] Summary  A healthy meal plan will help you control your blood glucose and maintain a healthy lifestyle.  Working with a diet and nutrition specialist (dietitian) can help you make a meal plan that is best for you.  Keep in mind that carbohydrates (carbs) and alcohol have immediate effects on your blood glucose levels. It is important to count carbs and to use alcohol carefully. This information is not intended to replace advice given to you by your health care provider. Make sure you  discuss any questions you have with your health care provider. Document Revised: 08/11/2017 Document Reviewed: 10/03/2016 Elsevier Patient Education  2020 [place] Eating Plan DASH stands for "Dietary Approaches to Stop Hypertension." The DASH eating plan is a healthy eating plan that has been shown to reduce high blood pressure (hypertension). It may also reduce your risk for type 2 diabetes, heart disease, and stroke. The DASH eating plan may also help with weight loss. What are tips for following this plan?  General guidelines  Avoid eating more than 2,300 mg (milligrams) of salt (sodium) a day. If you have hypertension, you may need to reduce your sodium intake to 1,500 mg a day.  Limit alcohol intake to no more than 1 drink a day for nonpregnant women and 2 drinks a day for men. One drink equals 12 oz of beer, 5 oz of  wine, or 1 oz of hard liquor.  Work with your health care provider to maintain a healthy body weight or to lose weight. Ask what an ideal weight is for you.  Get at least 30 minutes of exercise that causes your heart to beat faster (aerobic exercise) most days of the week. Activities may include walking, swimming, or biking.  Work with your health care provider or diet and nutrition specialist (dietitian) to adjust your eating plan to your individual calorie needs. Reading food labels   Check food labels for the amount of sodium per serving. Choose foods with less than 5 percent of the Daily Value of sodium. Generally, foods with less than 300 mg of sodium per serving fit into this eating plan.  To find whole grains, look for the word "whole" as the first word in the ingredient list. Shopping  Buy products labeled as "low-sodium" or "no salt added."  Buy fresh foods. Avoid canned foods and premade or frozen meals. Cooking  Avoid adding salt when cooking. Use salt-free seasonings or herbs instead of table salt or sea salt. Check with your health care provider or pharmacist before using salt substitutes.  Do not fry foods. Cook foods using healthy methods such as baking, boiling, grilling, and broiling instead.  Cook with heart-healthy oils, such as olive, canola, soybean, or sunflower oil. Meal planning  Eat a balanced diet that includes: ? 5 or more servings of fruits and vegetables each day. At each meal, try to fill half of your plate with fruits and vegetables. ? Up to 6-8 servings of whole grains each day. ? Less than 6 oz of lean meat, poultry, or fish each day. A 3-oz serving of meat is about the same size as a deck of cards. One egg equals 1 oz. ? 2 servings of low-fat dairy each day. ? A serving of nuts, seeds, or beans 5 times each week. ? Heart-healthy fats. Healthy fats called Omega-3 fatty acids are found in foods such as flaxseeds and coldwater fish, like sardines,  salmon, and mackerel.  Limit how much you eat of the following: ? Canned or prepackaged foods. ? Food that is high in trans fat, such as fried foods. ? Food that is high in saturated fat, such as fatty meat. ? Sweets, desserts, sugary drinks, and other foods with added sugar. ? Full-fat dairy products.  Do not salt foods before eating.  Try to eat at least 2 vegetarian meals each week.  Eat more home-cooked food and less restaurant, buffet, and fast food.  When eating at a restaurant, ask that your food be prepared with less  salt or no salt, if possible. What foods are recommended? The items listed may not be a complete list. Talk with your dietitian about what dietary choices are best for you. Grains Whole-grain or whole-wheat bread. Whole-grain or whole-wheat pasta. Brown rice. [place]. Bulgur. Whole-grain and low-sodium cereals. Pita bread. Low-fat, low-sodium crackers. Whole-wheat flour tortillas. Vegetables Fresh or frozen vegetables (raw, steamed, roasted, or grilled). Low-sodium or reduced-sodium tomato and vegetable juice. Low-sodium or reduced-sodium tomato sauce and tomato paste. Low-sodium or reduced-sodium canned vegetables. Fruits All fresh, dried, or frozen fruit. Canned fruit in natural juice (without added sugar). Meat and other protein foods Skinless chicken or [place]. Ground chicken or [place]. Pork with fat trimmed off. Fish and seafood. Egg whites. Dried beans, peas, or lentils. Unsalted nuts, nut butters, and seeds. Unsalted canned beans. Lean cuts of beef with fat trimmed off. Low-sodium, lean deli meat. Dairy Low-fat (1%) or fat-free (skim) milk. Fat-free, low-fat, or reduced-fat cheeses. Nonfat, low-sodium ricotta or cottage cheese. Low-fat or nonfat yogurt. Low-fat, low-sodium cheese. Fats and oils Soft margarine without trans fats. Vegetable oil. Low-fat, reduced-fat, or light mayonnaise and salad dressings (reduced-sodium). Canola, safflower, olive,  soybean, and sunflower oils. Avocado. Seasoning and other foods Herbs. Spices. Seasoning mixes without salt. Unsalted popcorn and pretzels. Fat-free sweets. What foods are not recommended? The items listed may not be a complete list. Talk with your dietitian about what dietary choices are best for you. Grains Baked goods made with fat, such as croissants, muffins, or some breads. Dry pasta or rice meal packs. Vegetables Creamed or fried vegetables. Vegetables in a cheese sauce. Regular canned vegetables (not low-sodium or reduced-sodium). Regular canned tomato sauce and paste (not low-sodium or reduced-sodium). Regular tomato and vegetable juice (not low-sodium or reduced-sodium). Angie Fava. Olives. Fruits Canned fruit in a light or heavy syrup. Fried fruit. Fruit in cream or butter sauce. Meat and other protein foods Fatty cuts of meat. Ribs. Fried meat. Berniece Salines. Sausage. Bologna and other processed lunch meats. Salami. Fatback. Hotdogs. Bratwurst. Salted nuts and seeds. Canned beans with added salt. Canned or smoked fish. Whole eggs or egg yolks. Chicken or [place] with skin. Dairy Whole or 2% milk, cream, and half-and-half. Whole or full-fat cream cheese. Whole-fat or sweetened yogurt. Full-fat cheese. Nondairy creamers. Whipped toppings. Processed cheese and cheese spreads. Fats and oils Butter. Stick margarine. Lard. Shortening. Ghee. Bacon fat. Tropical oils, such as coconut, palm kernel, or palm oil. Seasoning and other foods Salted popcorn and pretzels. Onion salt, garlic salt, seasoned salt, table salt, and sea salt. Worcestershire sauce. Tartar sauce. Barbecue sauce. Teriyaki sauce. Soy sauce, including reduced-sodium. Steak sauce. Canned and packaged gravies. Fish sauce. Oyster sauce. Cocktail sauce. Horseradish that you find on the shelf. Ketchup. Mustard. Meat flavorings and tenderizers. Bouillon cubes. Hot sauce and Tabasco sauce. Premade or packaged marinades. Premade or packaged taco  seasonings. Relishes. Regular salad dressings. Where to find more information:  National Heart, Lung, and [place]: https://wilson-eaton.com/  American Heart Association: www.heart.org Summary  The DASH eating plan is a healthy eating plan that has been shown to reduce high blood pressure (hypertension). It may also reduce your risk for type 2 diabetes, heart disease, and stroke.  With the DASH eating plan, you should limit salt (sodium) intake to 2,300 mg a day. If you have hypertension, you may need to reduce your sodium intake to 1,500 mg a day.  When on the DASH eating plan, aim to eat more fresh fruits and vegetables, whole grains, lean proteins, low-fat  dairy, and heart-healthy fats.  Work with your health care provider or diet and nutrition specialist (dietitian) to adjust your eating plan to your individual calorie needs. This information is not intended to replace advice given to you by your health care provider. Make sure you discuss any questions you have with your health care provider. Document Revised: 08/11/2017 Document Reviewed: 08/22/2016 Elsevier Patient Education  2020 [employer].

## 2020-05-26 LAB — HEMOGLOBIN A1C
Est. average glucose Bld gHb Est-mCnc: 131 mg/dL
Hgb A1c MFr Bld: 6.2 % — ABNORMAL HIGH (ref 4.8–5.6)

## 2020-05-26 LAB — LIPID PANEL
Chol/HDL Ratio: 2.5 ratio (ref 0.0–4.4)
Cholesterol, Total: 120 mg/dL (ref 100–199)
HDL: 48 mg/dL (ref >39)
LDL Chol Calc (NIH): 56 mg/dL (ref 0–99)
Triglycerides: 77 mg/dL (ref 0–149)
VLDL Cholesterol Cal: 16 mg/dL (ref 5–40)

## 2020-05-26 LAB — CARDIOVASCULAR RISK ASSESSMENT

## 2020-06-18 ENCOUNTER — Other Ambulatory Visit: Payer: Self-pay

## 2020-06-18 MED ORDER — ROSUVASTATIN CALCIUM 5 MG PO TABS: 5.0000 mg | ORAL_TABLET | Freq: Every day | ORAL | 1 refills | Status: DC

## 2020-07-06 ENCOUNTER — Other Ambulatory Visit: Payer: Self-pay | Admitting: Family Medicine

## 2020-07-14 ENCOUNTER — Telehealth: Payer: Self-pay | Admitting: Family Medicine

## 2020-07-14 NOTE — Progress Notes (Signed)
  Chronic Care Management   Outreach Note  07/14/2020 Name: Lisa Ortega MRN: [id number] DOB: [birth year]  Referred by: Rochel Brome, MD Reason for referral : Chronic Care Management   An unsuccessful telephone outreach was attempted today. The patient was referred to the pharmacist for assistance with care management and care coordination.   Follow Up Plan:   Hilario Quarry  [facility]

## 2020-07-20 ENCOUNTER — Telehealth: Payer: Self-pay

## 2020-07-20 NOTE — Telephone Encounter (Signed)
LM with the pts daughter. Pt is due for AWV. Last one was 11/21/2018. The daughter stated she will have [place] call back.

## 2020-08-20 ENCOUNTER — Other Ambulatory Visit: Payer: Self-pay

## 2020-08-20 DIAGNOSIS — Z1211 Encounter for screening for malignant neoplasm of colon: Secondary | ICD-10-CM

## 2020-08-20 NOTE — Progress Notes (Signed)
Cologuard ordered, last done 07/2017 resulted negative.

## 2020-08-21 ENCOUNTER — Encounter: Payer: Self-pay | Admitting: Family Medicine

## 2020-08-22 ENCOUNTER — Encounter: Payer: Self-pay | Admitting: Family Medicine

## 2020-09-18 ENCOUNTER — Other Ambulatory Visit: Payer: Self-pay | Admitting: *Deleted

## 2020-09-18 ENCOUNTER — Other Ambulatory Visit: Payer: Self-pay | Admitting: Family Medicine

## 2020-09-18 DIAGNOSIS — Z1231 Encounter for screening mammogram for malignant neoplasm of breast: Secondary | ICD-10-CM

## 2020-10-26 ENCOUNTER — Other Ambulatory Visit: Payer: Self-pay | Admitting: Family Medicine

## 2020-10-27 ENCOUNTER — Ambulatory Visit
Admission: RE | Admit: 2020-10-27 | Discharge: 2020-10-27 | Disposition: A | Discharge status: Home / Self Care | Payer: Medicare Other | Source: Ambulatory Visit | Attending: Family Medicine | Admitting: Family Medicine

## 2020-10-27 ENCOUNTER — Other Ambulatory Visit: Payer: Self-pay

## 2020-10-27 DIAGNOSIS — Z1231 Encounter for screening mammogram for malignant neoplasm of breast: Secondary | ICD-10-CM | POA: Diagnosis not present

## 2020-10-30 ENCOUNTER — Ambulatory Visit: Payer: Medicare Other

## 2020-11-02 ENCOUNTER — Other Ambulatory Visit: Payer: Self-pay | Admitting: Family Medicine

## 2020-11-02 DIAGNOSIS — R928 Other abnormal and inconclusive findings on diagnostic imaging of breast: Secondary | ICD-10-CM

## 2020-11-16 ENCOUNTER — Ambulatory Visit
Admission: RE | Admit: 2020-11-16 | Discharge: 2020-11-16 | Disposition: A | Discharge status: Home / Self Care | Payer: Medicare Other | Source: Ambulatory Visit | Attending: Family Medicine | Admitting: Family Medicine

## 2020-11-16 ENCOUNTER — Other Ambulatory Visit: Payer: Self-pay

## 2020-11-16 DIAGNOSIS — R928 Other abnormal and inconclusive findings on diagnostic imaging of breast: Secondary | ICD-10-CM

## 2020-11-16 DIAGNOSIS — N6001 Solitary cyst of right breast: Secondary | ICD-10-CM | POA: Diagnosis not present

## 2020-11-17 ENCOUNTER — Telehealth: Payer: Self-pay | Admitting: Family Medicine

## 2020-11-17 NOTE — Progress Notes (Signed)
  Chronic Care Management   Outreach Note  11/17/2020 Name: Lisa Ortega MRN: [id number] DOB: 22-Sep-1945  Referred by: Rochel Brome, MD Reason for referral : No chief complaint on file.   A second unsuccessful telephone outreach was attempted today. The patient was referred to pharmacist for assistance with care management and care coordination.  Follow Up Plan:   Carley Perdue [employer] [facility]

## 2020-11-19 ENCOUNTER — Telehealth: Payer: Self-pay | Admitting: Family Medicine

## 2020-11-19 NOTE — Progress Notes (Signed)
°  Chronic Care Management   Outreach Note  11/19/2020 Name: Lisa Ortega MRN: [id number] DOB: 1946/01/22  Referred by: Rochel Brome, MD Reason for referral : Chronic Care Management   Third unsuccessful telephone outreach was attempted today. The patient was referred to the pharmacist for assistance with care management and care coordination.   Follow Up Plan:   Hilario Quarry  [facility]

## 2020-11-21 NOTE — Progress Notes (Signed)
Subjective:  Patient ID: Lisa Ortega, female    DOB: 10-01-1945  Age: 75 y.o. MRN: [id number]  Chief Complaint  Patient presents with  . Hyperlipidemia  . Hypertension    HPI   Stage 3a chronic kidney disease ([place]): furosemide 40 mg twice daily Impaired fasting glucose Changing to wheat bread from white bread. Trying to cut back on sweets.  Mixed hyperlipidemia: rosuvastatin 5 mg once daily. Tries to eat low fat.  Not exercising because knee pain BL. Standing hurts her low back.  Essential hypertension, benign: losartan 25 mg once daily, atenolol 50 mg twice daily      Current Outpatient Medications on File Prior to Visit  Medication Sig Dispense Refill  . atenolol (TENORMIN) 50 MG tablet TAKE 1 TABLET BY MOUTH TWICE A DAY 180 tablet 1  . Cholecalciferol (VITAMIN D3) 25 MCG (1000 UT) CAPS Take 2 capsules by mouth daily.     . furosemide (LASIX) 40 MG tablet Take 40 mg by mouth 2 (two) times daily.    Marland Kitchen KLOR-CON M20 20 MEQ tablet Take 20 mEq by mouth daily.    Marland Kitchen losartan (COZAAR) 25 MG tablet Take 1 tablet by mouth once daily 90 tablet 0  . rosuvastatin (CRESTOR) 5 MG tablet Take 1 tablet (5 mg total) by mouth daily. 90 tablet 1   No current facility-administered medications on file prior to visit.   Past Medical History:  Diagnosis Date  . Acute on chronic diastolic (congestive) heart failure ([place])   . Essential (primary) hypertension   . Hypertensive chronic kidney disease with stage 1 through stage 4 chronic kidney disease, or unspecified chronic kidney disease   . Impaired fasting glucose   . Localized edema   . Mixed hyperlipidemia   . Morbid (severe) obesity due to excess calories ([place])   . Other seasonal allergic rhinitis   . Other specified nonscarring hair loss   . Telogen effluvium    Past Surgical History:  Procedure Laterality Date  . TONSILLECTOMY      Family History  Problem Relation Age of Onset  . Transient ischemic attack Mother   .  Alzheimer's disease Mother   . Stroke Father   . Throat cancer Father   . Breast cancer Sister   . Pulmonary embolism Brother   . Asthma Other   . Diabetes type II Other   . Hypertension Other   . Hypothyroidism Other   . Heart attack Other   . Migraines Other   . Osteoarthritis Other   . Breast cancer Niece    Social History   Socioeconomic History  . Marital status: Divorced    Spouse name: Not on file  . Number of children: 3  . Years of education: Not on file  . Highest education level: Not on file  Occupational History  . Occupation: Environmental health practitioner  Tobacco Use  . Smoking status: Never Smoker  . Smokeless tobacco: Never Used  Vaping Use  . Vaping Use: Never used  Substance and Sexual Activity  . Alcohol use: Never  . Drug use: Never  . Sexual activity: Not on file  Other Topics Concern  . Not on file  Social History Narrative  . Not on file   Social Determinants of Health   Financial Resource Strain: Not on file  Food Insecurity: Not on file  Transportation Needs: Not on file  Physical Activity: Not on file  Stress: Not on file  Social Connections: Not on file  Review of Systems  Constitutional: Negative for chills, fatigue and fever.  HENT: Positive for rhinorrhea. Negative for congestion, ear pain and sore throat.   Respiratory: Negative for cough and shortness of breath.   Cardiovascular: Negative for chest pain.  Gastrointestinal: Negative for abdominal pain, constipation, diarrhea, nausea and vomiting.  Endocrine: Negative for polydipsia, polyphagia and polyuria.  Genitourinary: Negative for dysuria and frequency.  Musculoskeletal: Positive for arthralgias (BL knee) and back pain. Negative for myalgias.  Skin: Negative for rash.  Neurological: Negative for headaches.  Psychiatric/Behavioral: Negative for dysphoric mood. The patient is not nervous/anxious.      Objective:  BP 118/72   Pulse 78   Temp (!) 97.3 F (36.3 C)   Ht 5\' 1"  (1.549  m)   Wt 222 lb (100.7 kg)   SpO2 98%   BMI 41.95 kg/m   BP/Weight 11/23/2020 05/25/2020 01/06/622  Systolic BP 762 831 517  Diastolic BP 72 70 50  Wt. (Lbs) 222 218 221  BMI 41.95 41.19 41.76    Physical Exam Constitutional:      Appearance: Normal appearance. She is obese.  HENT:     Right Ear: Tympanic membrane normal.     Left Ear: Tympanic membrane normal.     Nose: Nose normal.  Cardiovascular:     Rate and Rhythm: Normal rate and regular rhythm.     Pulses: Normal pulses.     Heart sounds: Normal heart sounds.  Pulmonary:     Effort: Pulmonary effort is normal.     Breath sounds: Normal breath sounds.  Abdominal:     General: Bowel sounds are normal.  Neurological:     Mental Status: She is alert and oriented to person, place, and time.  Psychiatric:        Mood and Affect: Mood normal.        Behavior: Behavior normal.      Lab Results  Component Value Date   WBC 7.4 11/23/2020   HGB 12.6 11/23/2020   HCT 37.6 11/23/2020   PLT 225 11/23/2020   GLUCOSE 138 (H) 11/23/2020   CHOL 118 11/23/2020   TRIG 69 11/23/2020   HDL 52 11/23/2020   LDLCALC 52 11/23/2020   ALT 14 11/23/2020   AST 24 11/23/2020   NA 144 11/23/2020   K 5.0 11/23/2020   CL 101 11/23/2020   CREATININE 1.25 (H) 11/23/2020   BUN 16 11/23/2020   CO2 27 11/23/2020   TSH 3.270 11/21/2019   HGBA1C 6.0 (H) 11/23/2020      Assessment & Plan:   1. Hypertensive kidney disease with stage 3a chronic kidney disease ([place]) Well controlled.  No changes to medicines.  Continue to work on eating a healthy diet and exercise.  Labs drawn today.  - Comprehensive metabolic panel - Interpretation: - Cardiovascular Risk Assessment  2. Impaired fasting glucose Recommend continue to work on eating healthy diet and exercise. - CBC with Differential/Platelet - Hemoglobin A1c  3. Mixed hyperlipidemia Well controlled.  No changes to medicines.  Continue to work on eating a healthy diet and  exercise.  Labs drawn today.  - Lipid panel  4. Screening for colon cancer - Ambulatory referral to Gastroenterology  5. Need for hepatitis C screening test - HCV Ab w Reflex to Quant PCR  6. Obesity, Class III, BMI 40-49.9 (morbid obesity) ([place])  Recommend continue to work on eating healthy diet and exercise.   Orders Placed This Encounter  Procedures  . CBC with Differential/Platelet  .  Hemoglobin A1c  . Comprehensive metabolic panel  . Lipid panel  . HCV Ab w Reflex to Quant PCR  . Interpretation:  . Cardiovascular Risk Assessment  . Ambulatory referral to Gastroenterology    Follow-up: Return in about 4 months (around 03/25/2021) for [place]. FOLLOW UP IN 6 MONTHS FASTING WITH DR. Jayleon Mcfarlane.  An After Visit Summary was printed and given to the patient.  Rochel Brome, MD [facility] [phone]

## 2020-11-23 ENCOUNTER — Encounter: Payer: Self-pay | Admitting: Family Medicine

## 2020-11-23 ENCOUNTER — Ambulatory Visit (INDEPENDENT_AMBULATORY_CARE_PROVIDER_SITE_OTHER): Payer: Medicare Other | Admitting: Family Medicine

## 2020-11-23 ENCOUNTER — Other Ambulatory Visit: Payer: Self-pay

## 2020-11-23 VITALS — BP 118/72 | HR 78 | Temp 97.3°F | Ht 61.0 in | Wt 222.0 lb

## 2020-11-23 DIAGNOSIS — R7301 Impaired fasting glucose: Secondary | ICD-10-CM | POA: Diagnosis not present

## 2020-11-23 DIAGNOSIS — E782 Mixed hyperlipidemia: Secondary | ICD-10-CM

## 2020-11-23 DIAGNOSIS — Z1159 Encounter for screening for other viral diseases: Secondary | ICD-10-CM

## 2020-11-23 DIAGNOSIS — I129 Hypertensive chronic kidney disease with stage 1 through stage 4 chronic kidney disease, or unspecified chronic kidney disease: Secondary | ICD-10-CM | POA: Diagnosis not present

## 2020-11-23 DIAGNOSIS — N1831 Chronic kidney disease, stage 3a: Secondary | ICD-10-CM | POA: Diagnosis not present

## 2020-11-23 DIAGNOSIS — Z1211 Encounter for screening for malignant neoplasm of colon: Secondary | ICD-10-CM

## 2020-11-23 DIAGNOSIS — I10 Essential (primary) hypertension: Secondary | ICD-10-CM | POA: Diagnosis not present

## 2020-11-24 LAB — CBC WITH DIFFERENTIAL/PLATELET
Basophils Absolute: 0.1 10*3/uL (ref 0.0–0.2)
Basos: 1 %
EOS (ABSOLUTE): 0.2 10*3/uL (ref 0.0–0.4)
Eos: 2 %
Hematocrit: 37.6 % (ref 34.0–46.6)
Hemoglobin: 12.6 g/dL (ref 11.1–15.9)
Immature Grans (Abs): 0 10*3/uL (ref 0.0–0.1)
Immature Granulocytes: 0 %
Lymphocytes Absolute: 2.3 10*3/uL (ref 0.7–3.1)
Lymphs: 31 %
MCH: 28.6 pg (ref 26.6–33.0)
MCHC: 33.5 g/dL (ref 31.5–35.7)
MCV: 85 fL (ref 79–97)
Monocytes Absolute: 0.5 10*3/uL (ref 0.1–0.9)
Monocytes: 7 %
Neutrophils Absolute: 4.3 10*3/uL (ref 1.4–7.0)
Neutrophils: 59 %
Platelets: 225 10*3/uL (ref 150–450)
RBC: 4.41 x10E6/uL (ref 3.77–5.28)
RDW: 12.7 % (ref 11.7–15.4)
WBC: 7.4 10*3/uL (ref 3.4–10.8)

## 2020-11-24 LAB — HCV INTERPRETATION

## 2020-11-24 LAB — LIPID PANEL
Chol/HDL Ratio: 2.3 ratio (ref 0.0–4.4)
Cholesterol, Total: 118 mg/dL (ref 100–199)
HDL: 52 mg/dL (ref >39)
LDL Chol Calc (NIH): 52 mg/dL (ref 0–99)
Triglycerides: 69 mg/dL (ref 0–149)
VLDL Cholesterol Cal: 14 mg/dL (ref 5–40)

## 2020-11-24 LAB — COMPREHENSIVE METABOLIC PANEL
ALT: 14 IU/L (ref 0–32)
AST: 24 IU/L (ref 0–40)
Albumin/Globulin Ratio: 1.3 (ref 1.2–2.2)
Albumin: 4.2 g/dL (ref 3.7–4.7)
Alkaline Phosphatase: 99 IU/L (ref 44–121)
BUN/Creatinine Ratio: 13 (ref 12–28)
BUN: 16 mg/dL (ref 8–27)
Bilirubin Total: 0.5 mg/dL (ref 0.0–1.2)
CO2: 27 mmol/L (ref 20–29)
Calcium: 9.4 mg/dL (ref 8.7–10.3)
Chloride: 101 mmol/L (ref 96–106)
Creatinine, Ser: 1.25 mg/dL — ABNORMAL HIGH (ref 0.57–1.00)
Globulin, Total: 3.2 g/dL (ref 1.5–4.5)
Glucose: 138 mg/dL — ABNORMAL HIGH (ref 65–99)
Potassium: 5 mmol/L (ref 3.5–5.2)
Sodium: 144 mmol/L (ref 134–144)
Total Protein: 7.4 g/dL (ref 6.0–8.5)
eGFR: 45 mL/min/{1.73_m2} — ABNORMAL LOW (ref >59)

## 2020-11-24 LAB — HCV AB W REFLEX TO QUANT PCR: HCV Ab: 0.2 s/co ratio (ref 0.0–0.9)

## 2020-11-24 LAB — HEMOGLOBIN A1C
Est. average glucose Bld gHb Est-mCnc: 126 mg/dL
Hgb A1c MFr Bld: 6 % — ABNORMAL HIGH (ref 4.8–5.6)

## 2020-11-24 LAB — CARDIOVASCULAR RISK ASSESSMENT

## 2020-11-25 ENCOUNTER — Other Ambulatory Visit: Payer: Self-pay

## 2020-11-25 MED ORDER — FUROSEMIDE 40 MG PO TABS: 40.0000 mg | ORAL_TABLET | Freq: Every day | ORAL | 1 refills | Status: DC

## 2020-11-26 ENCOUNTER — Other Ambulatory Visit: Payer: Self-pay | Admitting: Family Medicine

## 2020-11-26 DIAGNOSIS — Z1211 Encounter for screening for malignant neoplasm of colon: Secondary | ICD-10-CM | POA: Diagnosis not present

## 2020-11-26 LAB — COLOGUARD: Cologuard: NEGATIVE

## 2020-12-02 LAB — COLOGUARD: COLOGUARD: NEGATIVE

## 2020-12-02 LAB — EXTERNAL GENERIC LAB PROCEDURE: COLOGUARD: NEGATIVE

## 2020-12-16 ENCOUNTER — Other Ambulatory Visit: Payer: Self-pay | Admitting: Family Medicine

## 2021-01-11 ENCOUNTER — Other Ambulatory Visit: Payer: Self-pay | Admitting: Family Medicine

## 2021-01-20 ENCOUNTER — Other Ambulatory Visit: Payer: Self-pay | Admitting: Family Medicine

## 2021-01-30 DIAGNOSIS — J3089 Other allergic rhinitis: Secondary | ICD-10-CM | POA: Diagnosis not present

## 2021-01-30 DIAGNOSIS — J209 Acute bronchitis, unspecified: Secondary | ICD-10-CM | POA: Diagnosis not present

## 2021-02-20 DIAGNOSIS — Z79899 Other long term (current) drug therapy: Secondary | ICD-10-CM | POA: Diagnosis not present

## 2021-02-20 DIAGNOSIS — R197 Diarrhea, unspecified: Secondary | ICD-10-CM | POA: Diagnosis not present

## 2021-02-20 DIAGNOSIS — R531 Weakness: Secondary | ICD-10-CM | POA: Diagnosis not present

## 2021-02-20 DIAGNOSIS — I517 Cardiomegaly: Secondary | ICD-10-CM | POA: Diagnosis not present

## 2021-02-22 ENCOUNTER — Other Ambulatory Visit: Payer: Self-pay

## 2021-02-22 MED ORDER — FUROSEMIDE 40 MG PO TABS: 40.0000 mg | ORAL_TABLET | Freq: Every day | ORAL | 1 refills | Status: DC

## 2021-02-25 NOTE — Progress Notes (Signed)
Acute Office Visit  Subjective:    Patient ID: Lisa Ortega, female    DOB: August 20, 1946, 75 y.o.   MRN: [id number]  CC: ED f/u weakness   HPI  Follow up ER visit  Lisa Ortega is a 75 year old year old Caucasian female is in today for ED follow-up for generalized weakness and diarrhea for 5 days. She is a accompanied by her daughter today that helps supplement history. States diarrhea has subsided. Pt was seen at [facility] ED on 02/20/21. Labs, EKG, and chest x-ray obtained were reviewed. No interventions or treatments were given. Pt has a history of hypertensive CKD stage 3. She is followed by nephrology, Dr Carolin Sicks with [facility]. CMP on 02/20/21 revealed BUN 27, Creatinine 1.20, and GFR 44. No anemia per CBC. UA revealed hematuria and few bacteria. CXR shows mild cardiomegaly and EKG revealed NSR, left axis deviation, anterolateral infarct, age undetermined.   She has had decreased oral intake and is sleeping more often. Daughter tells me Lisa Ortega has new dentures that do not fitting properly, may be causing gagging and nausea. States Lisa Ortega is experiencing dyspnea with minimal activity and bilateral leg swelling. She has a history of hypertension and CHF. Denies recent cardiology visit.Pro BNP 151, Troponin I < 0.01 on 02/20/21   Lisa Ortega's daughter states she has witnessed gradual cognitive decline. She tells me that Lisa Ortega is slow to respond to questions, often "stares off", and has intermittent periods of confusion. Daughter added Lisa Ortega's mother had TIAs/Alzheimer dementia and father had CVA.    Past Medical History:  Diagnosis Date   Acute on chronic diastolic (congestive) heart failure ([facility])    Essential (primary) hypertension    Hypertensive chronic kidney disease with stage 1 through stage 4 chronic kidney disease, or unspecified chronic kidney disease    Impaired fasting glucose    Localized edema    Mixed hyperlipidemia    Morbid (severe) obesity due to excess calories  ([facility])    Other seasonal allergic rhinitis    Other specified nonscarring hair loss    Telogen effluvium     Past Surgical History:  Procedure Laterality Date   TONSILLECTOMY      Family History  Problem Relation Age of Onset   Transient ischemic attack Mother    Alzheimer's disease Mother    Stroke Father    Throat cancer Father    Breast cancer Sister    Pulmonary embolism Brother    Asthma Other    Diabetes type II Other    Hypertension Other    Hypothyroidism Other    Heart attack Other    Migraines Other    Osteoarthritis Other    Breast cancer Niece     Social History   Socioeconomic History   Marital status: Divorced    Spouse name: Not on file   Number of children: 3   Years of education: Not on file   Highest education level: Not on file  Occupational History   Occupation: Environmental health practitioner  Tobacco Use   Smoking status: Never   Smokeless tobacco: Never  Vaping Use   Vaping Use: Never used  Substance and Sexual Activity   Alcohol use: Never   Drug use: Never   Sexual activity: Not on file  Other Topics Concern   Not on file  Social History Narrative   Not on file   Social Determinants of Health   Financial Resource Strain: Not on file  Food Insecurity: Not on file  Transportation Needs: Not on  file  Physical Activity: Not on file  Stress: Not on file  Social Connections: Not on file  Intimate Partner Violence: Not on file    Outpatient Medications Prior to Visit  Medication Sig Dispense Refill   atenolol (TENORMIN) 50 MG tablet Take 1 tablet by mouth twice daily 180 tablet 1   Cholecalciferol (VITAMIN D3) 25 MCG (1000 UT) CAPS Take 2 capsules by mouth daily.      furosemide (LASIX) 40 MG tablet Take 1 tablet (40 mg total) by mouth daily. 30 tablet 1   KLOR-CON M20 20 MEQ tablet Take 20 mEq by mouth daily.     losartan (COZAAR) 25 MG tablet Take 1 tablet by mouth once daily 90 tablet 1   rosuvastatin (CRESTOR) 5 MG tablet Take 1 tablet by mouth  once daily 90 tablet 0   No facility-administered medications prior to visit.    Allergies  Allergen Reactions   Amlodipine Other (See Comments)    Alopecia   Lisinopril-Hydrochlorothiazide     Review of Systems  Constitutional:  Positive for appetite change (decreased) and fatigue.  HENT:  Positive for trouble swallowing.        New dentures, no fitting properly  Eyes: Negative.   Respiratory:  Positive for shortness of breath (with activity). Negative for chest tightness.   Cardiovascular:  Positive for leg swelling (bilataral legs).  Gastrointestinal:  Positive for diarrhea and nausea.  Endocrine: Negative.   Genitourinary:  Negative for dysuria.  Musculoskeletal:  Positive for gait problem.  Skin: Negative.   Allergic/Immunologic: Negative.   Neurological:  Positive for weakness.       Slow response to questions  Psychiatric/Behavioral:  Positive for confusion.       Objective:    Physical Exam Vitals reviewed.  Constitutional:      Appearance: She is ill-appearing.  HENT:     Nose: Nose normal.     Mouth/Throat:     Mouth: Mucous membranes are dry.  Eyes:     Pupils: Pupils are equal, round, and reactive to light.  Cardiovascular:     Rate and Rhythm: Normal rate and regular rhythm.     Pulses: Normal pulses.     Heart sounds: Normal heart sounds.  Pulmonary:     Effort: Pulmonary effort is normal.     Breath sounds: Normal breath sounds.  Abdominal:     General: Bowel sounds are normal.     Palpations: Abdomen is soft.  Musculoskeletal:        General: Normal range of motion.     Right lower leg: 1+ Edema present.     Left lower leg: 1+ Edema present.  Skin:    General: Skin is warm and dry.     Capillary Refill: Capillary refill takes less than 2 seconds.  Neurological:     General: No focal deficit present.     Mental Status: She is alert and oriented to person, place, and time.     Motor: Weakness present.  Psychiatric:        Mood and Affect:  Mood normal.        Behavior: Behavior normal.   BP (!) 98/50   Pulse 88   Temp 97.7 F (36.5 C)   Resp 16   Ht $R'5\' 2"'vn$  (1.575 m)   Wt 212 lb (96.2 kg)   BMI 38.78 kg/m   Wt Readings from Last 3 Encounters:  11/23/20 222 lb (100.7 kg)  05/25/20 218 lb (98.9 kg)  11/21/19  221 lb (100.2 kg)    Health Maintenance Due  Topic Date Due   COVID-19 Vaccine (1) Never done   TETANUS/TDAP  Never done   Zoster Vaccines- Shingrix (1 of 2) Never done    Lab Results  Component Value Date   TSH 3.270 11/21/2019   Lab Results  Component Value Date   WBC 7.4 11/23/2020   HGB 12.6 11/23/2020   HCT 37.6 11/23/2020   MCV 85 11/23/2020   PLT 225 11/23/2020   Lab Results  Component Value Date   NA 144 11/23/2020   K 5.0 11/23/2020   CO2 27 11/23/2020   GLUCOSE 138 (H) 11/23/2020   BUN 16 11/23/2020   CREATININE 1.25 (H) 11/23/2020   BILITOT 0.5 11/23/2020   ALKPHOS 99 11/23/2020   AST 24 11/23/2020   ALT 14 11/23/2020   PROT 7.4 11/23/2020   ALBUMIN 4.2 11/23/2020   CALCIUM 9.4 11/23/2020   EGFR 45 (L) 11/23/2020   Lab Results  Component Value Date   CHOL 118 11/23/2020   Lab Results  Component Value Date   HDL 52 11/23/2020   Lab Results  Component Value Date   LDLCALC 52 11/23/2020   Lab Results  Component Value Date   TRIG 69 11/23/2020   Lab Results  Component Value Date   CHOLHDL 2.3 11/23/2020   Lab Results  Component Value Date   HGBA1C 6.0 (H) 11/23/2020       Assessment & Plan:   1. Hypertensive kidney disease with stage 3b chronic kidney disease ([place]) - Ambulatory referral to Cardiology  2. Pedal edema - Ambulatory referral to Cardiology  3. Dyspnea on exertion - Ambulatory referral to Cardiology  4. Acute cystitis without hematuria - POCT urinalysis dipstick - nitrofurantoin, macrocrystal-monohydrate, (MACROBID) 100 MG capsule; Take 1 capsule (100 mg total) by mouth 2 (two) times daily.  Dispense: 14 capsule; Refill: 0  5. Nausea -  Urine Culture - POCT urinalysis dipstick - ondansetron (ZOFRAN ODT) 4 MG disintegrating tablet; Take 1 tablet (4 mg total) by mouth every 8 (eight) hours as needed for nausea or vomiting.  Dispense: 20 tablet; Refill: 0  6. Anorexia - Urine Culture - POCT urinalysis dipstick -small frequent meals -return to dentist concerning ill-fitting dentures  7. Confusion and disorientation - Ambulatory referral to Neurology  8. Generalized weakness - Ambulatory referral to Neurology  9. Family history of Alzheimer's disease - Ambulatory referral to Neurology  10. Family history of cerebrovascular accident (CVA) in father - Ambulatory referral to Neurology     Take Macrobid 100 mg twice daily for 7 days Push fluids, especially water Increase physical activity Monitor BP at home, hold BP med if systolic under 562 mmHG We will call you with neurology appt and/or head ct Follow-up 1 week  Follow-up:1-week  Signed, Rip Harbour, NP

## 2021-02-26 ENCOUNTER — Ambulatory Visit (INDEPENDENT_AMBULATORY_CARE_PROVIDER_SITE_OTHER): Payer: Medicare Other | Admitting: Nurse Practitioner

## 2021-02-26 ENCOUNTER — Other Ambulatory Visit: Payer: Self-pay

## 2021-02-26 VITALS — BP 98/50 | HR 88 | Temp 97.7°F | Resp 16 | Ht 62.0 in | Wt 212.0 lb

## 2021-02-26 DIAGNOSIS — R410 Disorientation, unspecified: Secondary | ICD-10-CM

## 2021-02-26 DIAGNOSIS — R531 Weakness: Secondary | ICD-10-CM

## 2021-02-26 DIAGNOSIS — R630 Anorexia: Secondary | ICD-10-CM | POA: Diagnosis not present

## 2021-02-26 DIAGNOSIS — R0609 Other forms of dyspnea: Secondary | ICD-10-CM

## 2021-02-26 DIAGNOSIS — R110 Nausea: Secondary | ICD-10-CM | POA: Diagnosis not present

## 2021-02-26 DIAGNOSIS — N3000 Acute cystitis without hematuria: Secondary | ICD-10-CM | POA: Diagnosis not present

## 2021-02-26 DIAGNOSIS — N1832 Chronic kidney disease, stage 3b: Secondary | ICD-10-CM

## 2021-02-26 DIAGNOSIS — Z823 Family history of stroke: Secondary | ICD-10-CM

## 2021-02-26 DIAGNOSIS — R600 Localized edema: Secondary | ICD-10-CM | POA: Diagnosis not present

## 2021-02-26 DIAGNOSIS — Z820 Family history of epilepsy and other diseases of the nervous system: Secondary | ICD-10-CM

## 2021-02-26 DIAGNOSIS — R0600 Dyspnea, unspecified: Secondary | ICD-10-CM

## 2021-02-26 DIAGNOSIS — I129 Hypertensive chronic kidney disease with stage 1 through stage 4 chronic kidney disease, or unspecified chronic kidney disease: Secondary | ICD-10-CM

## 2021-02-26 LAB — POCT URINALYSIS DIPSTICK
Bilirubin, UA: NEGATIVE
Blood, UA: NEGATIVE
Glucose, UA: NEGATIVE
Ketones, UA: NEGATIVE
Nitrite, UA: NEGATIVE
Protein, UA: NEGATIVE
Spec Grav, UA: 1.02 (ref 1.010–1.025)
Urobilinogen, UA: 0.2 E.U./dL
pH, UA: 6 (ref 5.0–8.0)

## 2021-02-26 MED ORDER — NITROFURANTOIN MONOHYD MACRO 100 MG PO CAPS: 100.0000 mg | ORAL_CAPSULE | Freq: Two times a day (BID) | ORAL | 0 refills | Status: DC

## 2021-02-26 MED ORDER — ONDANSETRON 4 MG PO TBDP: 4.0000 mg | ORAL_TABLET | Freq: Three times a day (TID) | ORAL | 0 refills | Status: DC | PRN

## 2021-02-26 NOTE — Patient Instructions (Addendum)
Take Macrobid 100 mg twice daily for 7 days Push fluids, especially water Increase physical activity Monitor BP at home, hold BP med if systolic under 829 mmHG We will call you with neurology appt and/or head ct Follow-up as needed   Exercises to do While Sitting  Exercises that you do while sitting (chair exercises) can give you many of the same benefits as full exercise. Benefits include strengthening your heart, burning calories, and keeping muscles and joints healthy. Exercise can also improve your mood and help with depression andanxiety. You may benefit from chair exercises if you are unable to do standing exercises because of: Diabetic foot pain. Obesity. Illness. Arthritis. Recovery from surgery or injury. Breathing problems. Balance problems. Another type of disability. Before starting chair exercises, check with your health care provider or a physical therapist to find out how much exercise you can tolerate and which exercises are safe for you. If your health care provider approves: Start out slowly and build up over time. Aim to work up to about 10-20 minutes for each exercise session. Make exercise part of your daily routine. Drink water when you exercise. Do not wait until you are thirsty. Drink every 10-15 minutes. Stop exercising right away if you have pain, nausea, shortness of breath, or dizziness. If you are exercising in a wheelchair, make sure to lock the wheels. Ask your health care provider whether you can do tai chi or yoga. Many positions in these mind-body exercises can be modified to do while seated. Warm-up Before starting other exercises: Sit up as straight as you can. Have your knees bent at 90 degrees, which is the shape of the capital letter "L." Keep your feet flat on the floor. Sit at the front edge of your chair, if you can. Pull in (tighten) the muscles in your abdomen and stretch your spine and neck as straight as you can. Hold this position for a  few minutes. Breathe in and out evenly. Try to concentrate on your breathing, and relax your mind. Stretching Exercise A: Arm stretch Hold your arms out straight in front of your body. Bend your hands at the wrist with your fingers pointing up, as if signaling someone to stop. Notice the slight tension in your forearms as you hold the position. Keeping your arms out and your hands bent, rotate your hands outward as far as you can and hold this stretch. Aim to have your thumbs pointing up and your pinkie fingers pointing down. Slowly repeat arm stretches for one minute as tolerated. Exercise B: Leg stretch If you can move your legs, try to "draw" letters on the floor with the toes of your foot. Write your name with one foot. Write your name with the toes of your other foot. Slowly repeat the movements for one minute as tolerated. Exercise C: Reach for the sky Reach your hands as far over your head as you can to stretch your spine. Move your hands and arms as if you are climbing a rope. Slowly repeat the movements for one minute as tolerated. Range of motion exercises Exercise A: Shoulder roll Let your arms hang loosely at your sides. Lift just your shoulders up toward your ears, then let them relax back down. When your shoulders feel loose, rotate your shoulders in backward and forward circles. Do shoulder rolls slowly for one minute as tolerated. Exercise B: March in place As if you are marching, pump your arms and lift your legs up and down. Lift your knees as  high as you can. If you are unable to lift your knees, just pump your arms and move your ankles and feet up and down. March in place for one minute as tolerated. Exercise C: Seated jumping jacks Let your arms hang down straight. Keeping your arms straight, lift them up over your head. Aim to point your fingers to the ceiling. While you lift your arms, straighten your legs and slide your heels along the floor to your sides, as wide  as you can. As you bring your arms back down to your sides, slide your legs back together. If you are unable to use your legs, just move your arms. Slowly repeat seated jumping jacks for one minute as tolerated. Strengthening exercises Exercise A: Shoulder squeeze Hold your arms straight out from your body to your sides, with your elbows bent and your fists pointed at the ceiling. Keeping your arms in the bent position, move them forward so your elbows and forearms meet in front of your face. Open your arms back out as wide as you can with your elbows still bent, until you feel your shoulder blades squeezing together. Hold for 5 seconds. Slowly repeat the movements forward and backward for one minute as tolerated. Contact a health care provider if you: Had to stop exercising due to any of the following: Pain. Nausea. Shortness of breath. Dizziness. Fatigue. Have significant pain or soreness after exercising. Get help right away if you have: Chest pain. Difficulty breathing. These symptoms may represent a serious problem that is an emergency. Do not wait to see if the symptoms will go away. Get medical help right away. Call your local emergency services (911 in the [place] Do not drive yourself to the hospital. This information is not intended to replace advice given to you by your health care provider. Make sure you discuss any questions you have with your healthcare provider. Document Revised: 12/09/2019 Document Reviewed: 12/26/2019 Elsevier Patient Education  2022 [place]. Urinary Tract Infection, Adult A urinary tract infection (UTI) is an infection of any part of the urinary tract. The urinary tract includes: The kidneys. The ureters. The bladder. The urethra. These organs make, store, and get rid of pee (urine) in the body. What are the causes? This infection is caused by germs (bacteria) in your genital area. These germs grow and cause swelling (inflammation) of your  urinary tract. What increases the risk? The following factors may make you more likely to develop this condition: Using a small, thin tube (catheter) to drain pee. Not being able to control when you pee or poop (incontinence). Being female. If you are female, these things can increase the risk: Using these methods to prevent pregnancy: A medicine that kills sperm (spermicide). A device that blocks sperm (diaphragm). Having low levels of a female hormone (estrogen). Being pregnant. You are more likely to develop this condition if: You have genes that add to your risk. You are sexually active. You take antibiotic medicines. You have trouble peeing because of: A prostate that is bigger than normal, if you are female. A blockage in the part of your body that drains pee from the bladder. A kidney stone. A nerve condition that affects your bladder. Not getting enough to drink. Not peeing often enough. You have other conditions, such as: Diabetes. A weak disease-fighting system (immune system). Sickle cell disease. Gout. Injury of the spine. What are the signs or symptoms? Symptoms of this condition include: Needing to pee right away. Peeing small amounts often.  Pain or burning when peeing. Blood in the pee. Pee that smells bad or not like normal. Trouble peeing. Pee that is cloudy. Fluid coming from the vagina, if you are female. Pain in the belly or lower back. Other symptoms include: Vomiting. Not feeling hungry. Feeling mixed up (confused). This may be the first symptom in older adults. Being tired and grouchy (irritable). A fever. Watery poop (diarrhea). How is this treated? Taking antibiotic medicine. Taking other medicines. Drinking enough water. In some cases, you may need to see a specialist. Follow these instructions at home:  Medicines Take over-the-counter and prescription medicines only as told by your doctor. If you were prescribed an antibiotic medicine,  take it as told by your doctor. Do not stop taking it even if you start to feel better. General instructions Make sure you: Pee until your bladder is empty. Do not hold pee for a long time. Empty your bladder after sex. Wipe from front to back after peeing or pooping if you are a female. Use each tissue one time when you wipe. Drink enough fluid to keep your pee pale yellow. Keep all follow-up visits. Contact a doctor if: You do not get better after 1-2 days. Your symptoms go away and then come back. Get help right away if: You have very bad back pain. You have very bad pain in your lower belly. You have a fever. You have chills. You feeling like you will vomit or you vomit. Summary A urinary tract infection (UTI) is an infection of any part of the urinary tract. This condition is caused by germs in your genital area. There are many risk factors for a UTI. Treatment includes antibiotic medicines. Drink enough fluid to keep your pee pale yellow. This information is not intended to replace advice given to you by your health care provider. Make sure you discuss any questions you have with your healthcare provider. Document Revised: 04/10/2020 Document Reviewed: 04/10/2020 Elsevier Patient Education  [place].

## 2021-02-27 ENCOUNTER — Encounter: Payer: Self-pay | Admitting: Nurse Practitioner

## 2021-03-01 LAB — URINE CULTURE

## 2021-03-03 ENCOUNTER — Encounter: Payer: Self-pay | Admitting: Physician Assistant

## 2021-03-03 ENCOUNTER — Ambulatory Visit: Payer: Medicare Other | Admitting: Physician Assistant

## 2021-03-03 ENCOUNTER — Other Ambulatory Visit: Payer: Self-pay

## 2021-03-03 ENCOUNTER — Other Ambulatory Visit (INDEPENDENT_AMBULATORY_CARE_PROVIDER_SITE_OTHER): Payer: Medicare Other

## 2021-03-03 VITALS — BP 161/87 | HR 79 | Resp 20 | Ht 62.0 in | Wt 212.0 lb

## 2021-03-03 DIAGNOSIS — J302 Other seasonal allergic rhinitis: Secondary | ICD-10-CM | POA: Insufficient documentation

## 2021-03-03 DIAGNOSIS — R413 Other amnesia: Secondary | ICD-10-CM

## 2021-03-03 DIAGNOSIS — I129 Hypertensive chronic kidney disease with stage 1 through stage 4 chronic kidney disease, or unspecified chronic kidney disease: Secondary | ICD-10-CM | POA: Insufficient documentation

## 2021-03-03 DIAGNOSIS — R600 Localized edema: Secondary | ICD-10-CM | POA: Insufficient documentation

## 2021-03-03 DIAGNOSIS — N1832 Chronic kidney disease, stage 3b: Secondary | ICD-10-CM | POA: Insufficient documentation

## 2021-03-03 DIAGNOSIS — L658 Other specified nonscarring hair loss: Secondary | ICD-10-CM | POA: Insufficient documentation

## 2021-03-03 DIAGNOSIS — L650 Telogen effluvium: Secondary | ICD-10-CM | POA: Insufficient documentation

## 2021-03-03 DIAGNOSIS — I5032 Chronic diastolic (congestive) heart failure: Secondary | ICD-10-CM | POA: Insufficient documentation

## 2021-03-03 HISTORY — DX: Other amnesia: R41.3

## 2021-03-03 LAB — TSH: TSH: 3.09 u[IU]/mL (ref 0.35–4.50)

## 2021-03-03 LAB — VITAMIN B12: Vitamin B-12: 127 pg/mL — ABNORMAL LOW (ref 211–911)

## 2021-03-03 NOTE — Progress Notes (Deleted)
Subjective:  Patient ID: Lisa Ortega, female    DOB: 1946-08-29  Age: 75 y.o. MRN: [id number]  Chief Complaint  Patient presents with   Hypertensive kidney disease with stage 3b chronic kidney dis    HPI   Current Outpatient Medications on File Prior to Visit  Medication Sig Dispense Refill   albuterol (VENTOLIN HFA) 108 (90 Base) MCG/ACT inhaler SMARTSIG:1 Puff(s) By Mouth Every 6 Hours PRN     atenolol (TENORMIN) 50 MG tablet Take 1 tablet by mouth twice daily 180 tablet 1   Cholecalciferol (VITAMIN D3) 25 MCG (1000 UT) CAPS Take 2 capsules by mouth daily.      furosemide (LASIX) 40 MG tablet Take 1 tablet (40 mg total) by mouth daily. 30 tablet 1   KLOR-CON M20 20 MEQ tablet Take 20 mEq by mouth daily.     losartan (COZAAR) 25 MG tablet Take 1 tablet by mouth once daily 90 tablet 1   nitrofurantoin, macrocrystal-monohydrate, (MACROBID) 100 MG capsule Take 1 capsule (100 mg total) by mouth 2 (two) times daily. 14 capsule 0   ondansetron (ZOFRAN ODT) 4 MG disintegrating tablet Take 1 tablet (4 mg total) by mouth every 8 (eight) hours as needed for nausea or vomiting. 20 tablet 0   rosuvastatin (CRESTOR) 5 MG tablet Take 1 tablet by mouth once daily 90 tablet 0   No current facility-administered medications on file prior to visit.   Past Medical History:  Diagnosis Date   Acute on chronic diastolic (congestive) heart failure (HCC)    Essential (primary) hypertension    Hypertensive chronic kidney disease with stage 1 through stage 4 chronic kidney disease, or unspecified chronic kidney disease    Impaired fasting glucose    Localized edema    Mixed hyperlipidemia    Morbid (severe) obesity due to excess calories (HCC)    Other seasonal allergic rhinitis    Other specified nonscarring hair loss    Telogen effluvium    Past Surgical History:  Procedure Laterality Date   TONSILLECTOMY      Family History  Problem Relation Age of Onset   Transient ischemic attack Mother     Alzheimer's disease Mother    Stroke Father    Throat cancer Father    Breast cancer Sister    Pulmonary embolism Brother    Asthma Other    Diabetes type II Other    Hypertension Other    Hypothyroidism Other    Heart attack Other    Migraines Other    Osteoarthritis Other    Breast cancer Niece    Social History   Socioeconomic History   Marital status: Divorced    Spouse name: Not on file   Number of children: 3   Years of education: Not on file   Highest education level: Not on file  Occupational History   Occupation: Environmental health practitioner  Tobacco Use   Smoking status: Never   Smokeless tobacco: Never  Vaping Use   Vaping Use: Never used  Substance and Sexual Activity   Alcohol use: Never   Drug use: Never   Sexual activity: Not on file  Other Topics Concern   Not on file  Social History Narrative   Right handed   One story home   Drinks caffeine   Social Determinants of Health   Financial Resource Strain: Not on file  Food Insecurity: Not on file  Transportation Needs: Not on file  Physical Activity: Not on file  Stress: Not  on file  Social Connections: Not on file    Review of Systems  Constitutional:  Negative for chills, fatigue and fever.  HENT:  Negative for congestion, ear pain, rhinorrhea and sore throat.   Respiratory:  Negative for cough and shortness of breath.   Cardiovascular:  Negative for chest pain.  Gastrointestinal:  Negative for abdominal pain, constipation, diarrhea, nausea and vomiting.  Genitourinary:  Negative for dysuria and urgency.  Musculoskeletal:  Negative for back pain and myalgias.  Neurological:  Negative for dizziness, weakness, light-headedness and headaches.  Psychiatric/Behavioral:  Negative for dysphoric mood. The patient is not nervous/anxious.     Objective:  There were no vitals taken for this visit.  BP/Weight 03/03/2021 02/26/2021 9/32/3557  Systolic BP 322 98 025  Diastolic BP 87 50 72  Wt. (Lbs) 212 212 222   BMI 38.78 38.78 41.95    Physical Exam Vitals reviewed.  Constitutional:      Appearance: Normal appearance. She is normal weight.  Neck:     Vascular: No carotid bruit.  Cardiovascular:     Rate and Rhythm: Normal rate and regular rhythm.     Pulses: Normal pulses.     Heart sounds: Normal heart sounds.  Pulmonary:     Effort: Pulmonary effort is normal. No respiratory distress.     Breath sounds: Normal breath sounds.  Abdominal:     General: Abdomen is flat. Bowel sounds are normal.     Palpations: Abdomen is soft.     Tenderness: There is no abdominal tenderness.  Neurological:     Mental Status: She is alert and oriented to person, place, and time.  Psychiatric:        Mood and Affect: Mood normal.        Behavior: Behavior normal.    Diabetic Foot Exam - Simple   No data filed      Lab Results  Component Value Date   WBC 7.4 11/23/2020   HGB 12.6 11/23/2020   HCT 37.6 11/23/2020   PLT 225 11/23/2020   GLUCOSE 138 (H) 11/23/2020   CHOL 118 11/23/2020   TRIG 69 11/23/2020   HDL 52 11/23/2020   LDLCALC 52 11/23/2020   ALT 14 11/23/2020   AST 24 11/23/2020   NA 144 11/23/2020   K 5.0 11/23/2020   CL 101 11/23/2020   CREATININE 1.25 (H) 11/23/2020   BUN 16 11/23/2020   CO2 27 11/23/2020   TSH 3.270 11/21/2019   HGBA1C 6.0 (H) 11/23/2020      Assessment & Plan:   There are no diagnoses linked to this encounter.   No orders of the defined types were placed in this encounter.   No orders of the defined types were placed in this encounter.    Follow-up: No follow-ups on file.  An After Visit Summary was printed and given to the patient.  Rip Harbour, NP [place] [phone]

## 2021-03-03 NOTE — Patient Instructions (Signed)
It was a pleasure to see you today at our office.   Recommendations:  Neurocognitive evaluation at our office MRI of the brain, the office will call you to arrange you appointment Check B12 and TSH at the lab Follow up once the results of the above are available   RECOMMENDATIONS FOR ALL PATIENTS WITH MEMORY PROBLEMS: 1. Continue to exercise (Recommend 30 minutes of walking everyday, or 3 hours every week) 2. Increase social interactions - continue going to [place] and enjoy social gatherings with friends and family 3. Eat healthy, avoid fried foods and eat more fruits and vegetables 4. Maintain adequate blood pressure, blood sugar, and blood cholesterol level. Reducing the risk of stroke and cardiovascular disease also helps promoting better memory. 5. Avoid stressful situations. Live a simple life and avoid aggravations. Organize your time and prepare for the next day in anticipation. 6. Sleep well, avoid any interruptions of sleep and avoid any distractions in the bedroom that may interfere with adequate sleep quality 7. Avoid sugar, avoid sweets as there is a strong link between excessive sugar intake, diabetes, and cognitive impairment We discussed the Mediterranean diet, which has been shown to help patients reduce the risk of progressive memory disorders and reduces cardiovascular risk. This includes eating fish, eat fruits and green leafy vegetables, nuts like almonds and hazelnuts, walnuts, and also use olive oil. Avoid fast foods and fried foods as much as possible. Avoid sweets and sugar as sugar use has been linked to worsening of memory function.  There is always a concern of gradual progression of memory problems. If this is the case, then we may need to adjust level of care according to patient needs. Support, both to the patient and caregiver, should then be put into place.      You have been referred for a neuropsychological evaluation (i.e., evaluation of memory and thinking  abilities). Please bring someone with you to this appointment if possible, as it is helpful for the doctor to hear from both you and another adult who knows you well. Please bring eyeglasses and hearing aids if you wear them.    The evaluation will take approximately 3 hours and has two parts:   The first part is a clinical interview with the neuropsychologist (Dr. Melvyn Novas or Dr. Nicole Kindred). During the interview, the neuropsychologist will speak with you and the individual you brought to the appointment.    The second part of the evaluation is testing with the doctor's technician Hinton Dyer or Maudie Mercury). During the testing, the technician will ask you to remember different types of material, solve problems, and answer some questionnaires. Your family member will not be present for this portion of the evaluation.   Please note: We must reserve several hours of the neuropsychologist's time and the psychometrician's time for your evaluation appointment. As such, there is a No-Show fee of $100. If you are unable to attend any of your appointments, please contact our office as soon as possible to reschedule.    FALL PRECAUTIONS: Be cautious when walking. Scan the area for obstacles that may increase the risk of trips and falls. When getting up in the mornings, sit up at the edge of the bed for a few minutes before getting out of bed. Consider elevating the bed at the head end to avoid drop of blood pressure when getting up. Walk always in a well-lit room (use night lights in the walls). Avoid area rugs or power cords from appliances in the middle of the walkways.  Use a walker or a cane if necessary and consider physical therapy for balance exercise. Get your eyesight checked regularly.  FINANCIAL OVERSIGHT: Supervision, especially oversight when making financial decisions or transactions is also recommended.  HOME SAFETY: Consider the safety of the kitchen when operating appliances like stoves, microwave oven, and  blender. Consider having supervision and share cooking responsibilities until no longer able to participate in those. Accidents with firearms and other hazards in the house should be identified and addressed as well.   ABILITY TO BE LEFT ALONE: If patient is unable to contact 911 operator, consider using LifeLine, or when the need is there, arrange for someone to stay with patients. Smoking is a fire hazard, consider supervision or cessation. Risk of wandering should be assessed by caregiver and if detected at any point, supervision and safe proof recommendations should be instituted.  MEDICATION SUPERVISION: Inability to self-administer medication needs to be constantly addressed. Implement a mechanism to ensure safe administration of the medications.   DRIVING: Regarding driving, in patients with progressive memory problems, driving will be impaired. We advise to have someone else do the driving if trouble finding directions or if minor accidents are reported. Independent driving assessment is available to determine safety of driving.   If you are interested in the driving assessment, you can contact the following:  [employer] in [place]  [place] [phone] or [phone]    [place] refers to food and lifestyle choices that are based on the traditions of countries located on the [employer]. This way of eating has been shown to help prevent certain conditions and improve outcomes for people who have chronic diseases, like kidney disease and heart disease. What are tips for following this plan? Lifestyle  Cook and eat meals together with your family, when possible. Drink enough fluid to keep your urine clear or pale yellow. Be physically active every day. This includes: Aerobic exercise like running or swimming. Leisure  activities like gardening, walking, or housework. Get 7-8 hours of sleep each night. If recommended by your health care provider, drink red wine in moderation. This means 1 glass a day for nonpregnant women and 2 glasses a day for men. A glass of wine equals 5 oz (150 mL). Reading food labels  Check the serving size of packaged foods. For foods such as rice and pasta, the serving size refers to the amount of cooked product, not dry. Check the total fat in packaged foods. Avoid foods that have saturated fat or trans fats. Check the ingredients list for added sugars, such as corn syrup. Shopping  At the grocery store, buy most of your food from the areas near the walls of the store. This includes: Fresh fruits and vegetables (produce). Grains, beans, nuts, and seeds. Some of these may be available in unpackaged forms or large amounts (in bulk). Fresh seafood. Poultry and eggs. Low-fat dairy products. Buy whole ingredients instead of prepackaged foods. Buy fresh fruits and vegetables in-season from local farmers markets. Buy frozen fruits and vegetables in resealable bags. If you do not have access to quality fresh seafood, buy precooked frozen shrimp or canned fish, such as tuna, salmon, or sardines. Buy small amounts of raw or cooked vegetables, salads, or olives from the deli or salad bar at your store. Stock your pantry so you always have certain foods on hand, such as olive oil, canned tuna, canned tomatoes, rice,  pasta, and beans. Cooking  Cook foods with extra-virgin olive oil instead of using butter or other vegetable oils. Have meat as a side dish, and have vegetables or grains as your main dish. This means having meat in small portions or adding small amounts of meat to foods like pasta or stew. Use beans or vegetables instead of meat in common dishes like chili or lasagna. Experiment with different cooking methods. Try roasting or broiling vegetables instead of steaming or sauteing  them. Add frozen vegetables to soups, stews, pasta, or rice. Add nuts or seeds for added healthy fat at each meal. You can add these to yogurt, salads, or vegetable dishes. Marinate fish or vegetables using olive oil, lemon juice, garlic, and fresh herbs. Meal planning  Plan to eat 1 vegetarian meal one day each week. Try to work up to 2 vegetarian meals, if possible. Eat seafood 2 or more times a week. Have healthy snacks readily available, such as: Vegetable sticks with hummus. Greek yogurt. Fruit and nut trail mix. Eat balanced meals throughout the week. This includes: Fruit: 2-3 servings a day Vegetables: 4-5 servings a day Low-fat dairy: 2 servings a day Fish, poultry, or lean meat: 1 serving a day Beans and legumes: 2 or more servings a week Nuts and seeds: 1-2 servings a day Whole grains: 6-8 servings a day Extra-virgin olive oil: 3-4 servings a day Limit red meat and sweets to only a few servings a month What are my food choices? Mediterranean diet Recommended Grains: Whole-grain pasta. Brown rice. Bulgar wheat. Polenta. Couscous. Whole-wheat bread. [place]. Vegetables: Artichokes. Beets. Broccoli. Cabbage. Carrots. Eggplant. Green beans. Chard. Kale. Spinach. Onions. Leeks. Peas. Squash. Tomatoes. Peppers. Radishes. Fruits: Apples. Apricots. Avocado. Berries. Bananas. Cherries. Dates. Figs. Grapes. Lemons. Melon. Oranges. Peaches. Plums. Pomegranate. Meats and other protein foods: Beans. Almonds. Sunflower seeds. Pine nuts. Peanuts. [place]. Salmon. Scallops. Shrimp. [place]fl[place]. Tilapia. Clams. Oysters. Eggs. Dairy: Low-fat milk. Cheese. Greek yogurt. Beverages: Water. Red wine. Herbal tea. Fats and oils: Extra virgin olive oil. Avocado oil. Grape seed oil. Sweets and desserts: [place] yogurt with honey. Baked apples. Poached pears. Trail mix. Seasoning and other foods: Basil. Cilantro. Coriander. Cumin. Mint. Parsley. Sage. Rosemary. Tarragon. Garlic. Oregano. Thyme. Pepper.  Balsalmic vinegar. Tahini. Hummus. Tomato sauce. Olives. Mushrooms. Limit these Grains: Prepackaged pasta or rice dishes. Prepackaged cereal with added sugar. Vegetables: Deep fried potatoes (french fries). Fruits: Fruit canned in syrup. Meats and other protein foods: Beef. Pork. Lamb. Poultry with skin. Hot dogs. [place] Salines. Dairy: Ice cream. Sour cream. Whole milk. Beverages: Juice. Sugar-sweetened soft drinks. Beer. Liquor and spirits. Fats and oils: Butter. Canola oil. Vegetable oil. Beef fat (tallow). Lard. Sweets and desserts: Cookies. Cakes. Pies. Candy. Seasoning and other foods: Mayonnaise. Premade sauces and marinades. The items listed may not be a complete list. Talk with your dietitian about what dietary choices are right for you. Summary The Mediterranean diet includes both food and lifestyle choices. Eat a variety of fresh fruits and vegetables, beans, nuts, seeds, and whole grains. Limit the amount of red meat and sweets that you eat. Talk with your health care provider about whether it is safe for you to drink red wine in moderation. This means 1 glass a day for nonpregnant women and 2 glasses a day for men. A glass of wine equals 5 oz (150 mL). This information is not intended to replace advice given to you by your health care provider. Make sure you discuss any questions you have with your health care  provider. Document Released: 04/21/2016 Document Revised: 05/24/2016 Document Reviewed: 04/21/2016 Elsevier Interactive Patient Education  2017 [employer].
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
## 2021-03-04 ENCOUNTER — Ambulatory Visit (INDEPENDENT_AMBULATORY_CARE_PROVIDER_SITE_OTHER): Payer: Medicare Other | Admitting: Nurse Practitioner

## 2021-03-04 ENCOUNTER — Encounter: Payer: Self-pay | Admitting: Cardiology

## 2021-03-04 ENCOUNTER — Encounter: Payer: Self-pay | Admitting: Nurse Practitioner

## 2021-03-04 ENCOUNTER — Other Ambulatory Visit: Payer: Self-pay | Admitting: Nurse Practitioner

## 2021-03-04 ENCOUNTER — Ambulatory Visit: Payer: Medicare Other | Admitting: Cardiology

## 2021-03-04 VITALS — BP 118/60 | HR 77 | Ht 62.0 in | Wt 211.6 lb

## 2021-03-04 VITALS — BP 106/60 | HR 72 | Temp 97.5°F | Resp 14 | Ht 62.0 in | Wt 209.0 lb

## 2021-03-04 DIAGNOSIS — I129 Hypertensive chronic kidney disease with stage 1 through stage 4 chronic kidney disease, or unspecified chronic kidney disease: Secondary | ICD-10-CM | POA: Diagnosis not present

## 2021-03-04 DIAGNOSIS — E782 Mixed hyperlipidemia: Secondary | ICD-10-CM

## 2021-03-04 DIAGNOSIS — R011 Cardiac murmur, unspecified: Secondary | ICD-10-CM | POA: Insufficient documentation

## 2021-03-04 DIAGNOSIS — R0609 Other forms of dyspnea: Secondary | ICD-10-CM | POA: Insufficient documentation

## 2021-03-04 DIAGNOSIS — I10 Essential (primary) hypertension: Secondary | ICD-10-CM | POA: Diagnosis not present

## 2021-03-04 DIAGNOSIS — M170 Bilateral primary osteoarthritis of knee: Secondary | ICD-10-CM

## 2021-03-04 DIAGNOSIS — R531 Weakness: Secondary | ICD-10-CM | POA: Diagnosis not present

## 2021-03-04 DIAGNOSIS — R0600 Dyspnea, unspecified: Secondary | ICD-10-CM

## 2021-03-04 MED ORDER — FUROSEMIDE 40 MG PO TABS: 40.0000 mg | ORAL_TABLET | Freq: Every day | ORAL | 1 refills | Status: DC

## 2021-03-04 NOTE — Patient Instructions (Signed)
Medication Instructions:  No medication changes. *If you need a refill on your cardiac medications before your next appointment, please call your pharmacy*   Lab Work: None ordered If you have labs (blood work) drawn today and your tests are completely normal, you will receive your results only by: MyChart Message (if you have MyChart) OR A paper copy in the mail If you have any lab test that is abnormal or we need to change your treatment, we will call you to review the results.   Testing/Procedures: Your physician has requested that you have a lexiscan myoview. For further information please visit [email or web address]. Please follow instruction sheet, as given.  The test will take approximately 3 to 4 hours to complete; you may bring reading material.  If someone comes with you to your appointment, they will need to remain in the main lobby due to limited space in the testing area.   How to prepare for your Myocardial Perfusion Test: Do not eat or drink 3 hours prior to your test, except you may have water. Do not consume products containing caffeine (regular or decaffeinated) 12 hours prior to your test. (ex: coffee, chocolate, sodas, tea). Do bring a list of your current medications with you.  If not listed below, you may take your medications as normal. Do wear comfortable clothes (no dresses or overalls) and walking shoes, tennis shoes preferred (No heels or open toe shoes are allowed). Do NOT wear cologne, perfume, aftershave, or lotions (deodorant is allowed). If these instructions are not followed, your test will have to be rescheduled.  Your physician has requested that you have an echocardiogram. Echocardiography is a painless test that uses sound waves to create images of your heart. It provides your doctor with information about the size and shape of your heart and how well your heart's chambers and valves are working. This procedure takes approximately one hour. There are no  restrictions for this procedure.   Follow-Up: At [facility], you and your health needs are our priority.  As part of our continuing mission to provide you with exceptional heart care, we have created designated Provider Care Teams.  These Care Teams include your primary Cardiologist (physician) and Advanced Practice Providers (APPs -  Physician Assistants and Nurse Practitioners) who all work together to provide you with the care you need, when you need it.  We recommend signing up for the patient portal called "MyChart".  Sign up information is provided on this After Visit Summary.  MyChart is used to connect with patients for Virtual Visits (Telemedicine).  Patients are able to view lab/test results, encounter notes, upcoming appointments, etc.  Non-urgent messages can be sent to your provider as well.   To learn more about what you can do with MyChart, go to [email or web address].    Your next appointment:   6 month(s)  The format for your next appointment:   In Person  Provider:   Rajan Revankar, MD   Other Instructions Cardiac Nuclear Scan A cardiac nuclear scan is a test that is done to check the flow of blood to your heart. It is done when you are resting and when you are exercising. The test looks for problems such as: Not enough blood reaching a portion of the heart. The heart muscle not working as it should. You may need this test if: You have heart disease. You have had lab results that are not normal. You have had heart surgery or a balloon procedure to open up blocked arteries (  angioplasty). You have chest pain. You have shortness of breath. In this test, a special dye (tracer) is put into your bloodstream. The tracer will travel to your heart. A camera will then take pictures of your heart to see how the tracer moves through your heart. This test is usually done at a hospital and takes 2-4 hours. Tell a doctor about: Any allergies you have. All medicines you are  taking, including vitamins, herbs, eye drops, creams, and over-the-counter medicines. Any problems you or family members have had with anesthetic medicines. Any blood disorders you have. Any surgeries you have had. Any medical conditions you have. Whether you are pregnant or may be pregnant. What are the risks? Generally, this is a safe test. However, problems may occur, such as: Serious chest pain and heart attack. This is only a risk if the stress portion of the test is done. Rapid heartbeat. A feeling of warmth in your chest. This feeling usually does not last long. Allergic reaction to the tracer. What happens before the test? Ask your doctor about changing or stopping your normal medicines. This is important. Follow instructions from your doctor about what you cannot eat or drink. Remove your jewelry on the day of the test. What happens during the test? An IV tube will be inserted into one of your veins. Your doctor will give you a small amount of tracer through the IV tube. You will wait for 20-40 minutes while the tracer moves through your bloodstream. Your heart will be monitored with an electrocardiogram (ECG). You will lie down on an exam table. Pictures of your heart will be taken for about 15-20 minutes. You may also have a stress test. For this test, one of these things may be done: You will be asked to exercise on a treadmill or a stationary bike. You will be given medicines that will make your heart work harder. This is done if you are unable to exercise. When blood flow to your heart has peaked, a tracer will again be given through the IV tube. After 20-40 minutes, you will get back on the exam table. More pictures will be taken of your heart. Depending on the tracer that is used, more pictures may need to be taken 3-4 hours later. Your IV tube will be removed when the test is over. The test may vary among doctors and hospitals. What happens after the test? Ask your  doctor: Whether you can return to your normal schedule, including diet, activities, and medicines. Whether you should drink more fluids. This will help to remove the tracer from your body. Drink enough fluid to keep your pee (urine) pale yellow. Ask your doctor, or the department that is doing the test: When will my results be ready? How will I get my results? Summary A cardiac nuclear scan is a test that is done to check the flow of blood to your heart. Tell your doctor whether you are pregnant or may be pregnant. Before the test, ask your doctor about changing or stopping your normal medicines. This is important. Ask your doctor whether you can return to your normal activities. You may be asked to drink more fluids. This information is not intended to replace advice given to you by your health care provider. Make sure you discuss any questions you have with your health care provider. Document Revised: 12/19/2018 Document Reviewed: 02/12/2018 Elsevier Patient Education  2021 Elsevier Inc.    Echocardiogram An echocardiogram is a test that uses sound waves (ultrasound)   to produce images of the heart. Images from an echocardiogram can provide important information about: Heart size and shape. The size and thickness and movement of your heart's walls. Heart muscle function and strength. Heart valve function or if you have stenosis. Stenosis is when the heart valves are too narrow. If blood is flowing backward through the heart valves (regurgitation). A tumor or infectious growth around the heart valves. Areas of heart muscle that are not working well because of poor blood flow or injury from a heart attack. Aneurysm detection. An aneurysm is a weak or damaged part of an artery wall. The wall bulges out from the normal force of blood pumping through the body. Tell a health care provider about: Any allergies you have. All medicines you are taking, including vitamins, herbs, eye drops,  creams, and over-the-counter medicines. Any blood disorders you have. Any surgeries you have had. Any medical conditions you have. Whether you are pregnant or may be pregnant. What are the risks? Generally, this is a safe test. However, problems may occur, including an allergic reaction to dye (contrast) that may be used during the test. What happens before the test? No specific preparation is needed. You may eat and drink normally. What happens during the test? You will take off your clothes from the waist up and put on a hospital gown. Electrodes or electrocardiogram (ECG)patches may be placed on your chest. The electrodes or patches are then connected to a device that monitors your heart rate and rhythm. You will lie down on a table for an ultrasound exam. A gel will be applied to your chest to help sound waves pass through your skin. A handheld device, called a transducer, will be pressed against your chest and moved over your heart. The transducer produces sound waves that travel to your heart and bounce back (or "echo" back) to the transducer. These sound waves will be captured in real-time and changed into images of your heart that can be viewed on a video monitor. The images will be recorded on a computer and reviewed by your health care provider. You may be asked to change positions or hold your breath for a short time. This makes it easier to get different views or better views of your heart. In some cases, you may receive contrast through an IV in one of your veins. This can improve the quality of the pictures from your heart. The procedure may vary among health care providers and hospitals.    What can I expect after the test? You may return to your normal, everyday life, including diet, activities, and medicines, unless your health care provider tells you not to do that. Follow these instructions at home: It is up to you to get the results of your test. Ask your health care  provider, or the department that is doing the test, when your results will be ready. Keep all follow-up visits. This is important. Summary An echocardiogram is a test that uses sound waves (ultrasound) to produce images of the heart. Images from an echocardiogram can provide important information about the size and shape of your heart, heart muscle function, heart valve function, and other possible heart problems. You do not need to do anything to prepare before this test. You may eat and drink normally. After the echocardiogram is completed, you may return to your normal, everyday life, unless your health care provider tells you not to do that. This information is not intended to replace advice given to you by   your health care provider. Make sure you discuss any questions you have with your health care provider. Document Revised: 04/21/2020 Document Reviewed: 04/21/2020 Elsevier Patient Education  2021 Elsevier Inc.  

## 2021-03-04 NOTE — Progress Notes (Signed)
Cardiology Office Note:    Date:  03/04/2021   ID:  Lisa Ortega, DOB 01/13/1946, MRN [id number]  PCP:  Rochel Brome, MD  Cardiologist:  Jenean Lindau, MD   Referring MD: Rip Harbour, NP    ASSESSMENT:    1. Essential (primary) hypertension   2. Mixed hyperlipidemia   3. Hypertensive chronic kidney disease with stage 1 through stage 4 chronic kidney disease, or unspecified chronic kidney disease   4. Morbid (severe) obesity due to excess calories ([facility])   5. DOE (dyspnea on exertion)   6. Cardiac murmur    PLAN:    In order of problems listed above:  Primary prevention stressed with the patient.  Importance of compliance with diet medication stressed and she vocalized understanding.  Overall she leads a sedentary lifestyle.  She has issues with ambulation and ambulates with a cane. Essential hypertension: Blood pressure stable and diet was emphasized. Mixed dyslipidemia: Lipids were reviewed she is on statin therapy and lipids appear to be stable. Obesity: Weight reduction was stressed.  Lifestyle modification urged.  Diet was emphasized and she vocalized understanding.  She promises to comply. Cardiac murmur: Echocardiogram will be done to assess murmur heard on auscultation. Dyspnea on exertion: We will do a Lexiscan sestamibi to assess her symptoms.  In the interim if she has any significant symptoms she knows to go to the nearest emergency room. Patient will be seen in follow-up appointment in 6 months or earlier if the patient has any concerns    Medication Adjustments/Labs and Tests Ordered: Current medicines are reviewed at length with the patient today.  Concerns regarding medicines are outlined above.  No orders of the defined types were placed in this encounter.  No orders of the defined types were placed in this encounter.    History of Present Illness:    Lisa Ortega is a 75 y.o. female who is being seen today for the evaluation of dyspnea on  exertion at the request of Rip Harbour, NP.  Patient patient is a pleasant 75 year old female.  She has past medical history of essential hypertension, renal insufficiency, hyperlipidemia.  She essentially lives a sedentary lifestyle.  There is some history of dementia.  She mentions to me that she was referred by her doctor because of shortness of breath on exertion.  At the time of my evaluation, the patient is alert awake oriented and in no distress.  No chest pain orthopnea or PND.  Past Medical History:  Diagnosis Date   Acute on chronic diastolic (congestive) heart failure (HCC)    Essential (primary) hypertension    Essential hypertension, benign 11/21/2019   Hypertensive chronic kidney disease with stage 1 through stage 4 chronic kidney disease, or unspecified chronic kidney disease    Impaired fasting glucose    Localized edema    Memory loss 03/03/2021   Mixed hyperlipidemia    Morbid (severe) obesity due to excess calories ([facility])    Obesity, Class III, BMI 40-49.9 (morbid obesity) ([place]) 11/23/2019   Other seasonal allergic rhinitis    Other specified nonscarring hair loss    Pain in left knee 01/21/2020   Telogen effluvium     Past Surgical History:  Procedure Laterality Date   TONSILLECTOMY      Current Medications: Current Meds  Medication Sig   albuterol (VENTOLIN HFA) 108 (90 Base) MCG/ACT inhaler Inhale 1 puff into the lungs every 6 (six) hours as needed for wheezing or shortness of breath.  atenolol (TENORMIN) 50 MG tablet Take 1 tablet by mouth twice daily   Cholecalciferol (VITAMIN D3) 50 MCG (2000 UT) capsule Take 2,000 Units by mouth daily.   furosemide (LASIX) 40 MG tablet Take 1 tablet (40 mg total) by mouth daily.   KLOR-CON M20 20 MEQ tablet Take 20 mEq by mouth daily.   losartan (COZAAR) 25 MG tablet Take 1 tablet by mouth once daily   nitrofurantoin, macrocrystal-monohydrate, (MACROBID) 100 MG capsule Take 1 capsule (100 mg total) by mouth 2 (two) times  daily.   ondansetron (ZOFRAN ODT) 4 MG disintegrating tablet Take 1 tablet (4 mg total) by mouth every 8 (eight) hours as needed for nausea or vomiting.   rosuvastatin (CRESTOR) 5 MG tablet Take 1 tablet by mouth once daily     Allergies:   Amlodipine and Lisinopril-hydrochlorothiazide   Social History   Socioeconomic History   Marital status: Divorced    Spouse name: Not on file   Number of children: 3   Years of education: Not on file   Highest education level: Not on file  Occupational History   Occupation: Environmental health practitioner  Tobacco Use   Smoking status: Never   Smokeless tobacco: Never  Vaping Use   Vaping Use: Never used  Substance and Sexual Activity   Alcohol use: Never   Drug use: Never   Sexual activity: Not on file  Other Topics Concern   Not on file  Social History Narrative   Right handed   One story home   Drinks caffeine   Social Determinants of Health   Financial Resource Strain: Not on file  Food Insecurity: Not on file  Transportation Needs: Not on file  Physical Activity: Not on file  Stress: Not on file  Social Connections: Not on file     Family History: The patient's family history includes Alzheimer's disease in her mother; Asthma in an other family member; Breast cancer in her niece and sister; Diabetes type II in an other family member; Heart attack in an other family member; Hypertension in an other family member; Hypothyroidism in an other family member; Migraines in an other family member; Osteoarthritis in an other family member; Pulmonary embolism in her brother; Stroke in her father; Throat cancer in her father; Transient ischemic attack in her mother.  ROS:   Please see the history of present illness.    All other systems reviewed and are negative.  EKGs/Labs/Other Studies Reviewed:    The following studies were reviewed today: EKG reveals sinus rhythm and nonspecific ST-T changes   Recent Labs: 11/23/2020: ALT 14; BUN 16; Creatinine,  Ser 1.25; Hemoglobin 12.6; Platelets 225; Potassium 5.0; Sodium 144 03/03/2021: TSH 3.09  Recent Lipid Panel    Component Value Date/Time   CHOL 118 11/23/2020 1004   TRIG 69 11/23/2020 1004   HDL 52 11/23/2020 1004   CHOLHDL 2.3 11/23/2020 1004   LDLCALC 52 11/23/2020 1004    Physical Exam:    VS:  BP 118/60   Pulse 77   Ht 5\' 2"  (1.575 m)   Wt 211 lb 9.6 oz (96 kg)   SpO2 (!) 6%   BMI 38.70 kg/m     Wt Readings from Last 3 Encounters:  03/04/21 211 lb 9.6 oz (96 kg)  03/03/21 212 lb (96.2 kg)  02/26/21 212 lb (96.2 kg)     GEN: Patient is in no acute distress HEENT: Normal NECK: No JVD; No carotid bruits LYMPHATICS: No lymphadenopathy CARDIAC: S1 S2 regular, 2/6  systolic murmur at the apex. RESPIRATORY:  Clear to auscultation without rales, wheezing or rhonchi  ABDOMEN: Soft, non-tender, non-distended MUSCULOSKELETAL:  No edema; No deformity  SKIN: Warm and dry NEUROLOGIC:  Alert and oriented x 3 PSYCHIATRIC:  Normal affect    Signed, Jenean Lindau, MD  03/04/2021 11:15 AM    [place]

## 2021-03-04 NOTE — Patient Instructions (Addendum)
Continue medications Keep follow-up appointment with neurology and cardiology as scheduled Fall precautions Increase physical activity Follow-up with Dr Tobie Poet 05/26/21 at 9:00, fasting   How to Increase Your Level of Physical Activity Getting regular physical activity is important for your overall health and well-being. Most people do not get enough exercise. There are easy ways to increase your level of physical activity, even if you have not been very activein the past or if you are just starting out. How can increasing my physical activity affect me? Physical activity has many short-term and long-term benefits. Being active on a regular basis can improve your physical and mental health as well as provideother benefits. Physical health benefits Helping you lose weight or maintain a healthy weight. Strengthening your muscles and bones. Reducing your risk of certain long-term (chronic) diseases, including heart disease, cancer, and diabetes. Being able to move around more easily and for longer periods of time without getting tired (increased stamina). Improving your ability to fight off illness (enhanced immunity). Being able to sleep better. Helping you stay healthy as you get older, including: Helping you stay mobile, or capable of walking and moving around. Preventing accidents, such as falls. Increasing life expectancy. Mental health benefits Boosting your mood and improving your self-esteem. Lowering your chance of having mental health problems, such as depression or anxiety. Helping you feel good about your body. Other benefits Finding new sources of fun and enjoyment. Meeting new people who share a common interest. What steps can I take to be more physically active? Getting started If you have a chronic illness or have not been active for a while, check with your health care provider about how to get started. Ask your health care provider what activities are safe for you. Start  out slowly. Walking or doing some simple chair exercises is a good place to start, especially if you have not been active before or for a long time. Set goals that you can work toward. Ask your health care provider how much exercise is best for you. In general, most adults should: Do moderate-intensity exercise for at least 150 minutes each week (30 minutes on most days of the week) or vigorous exercise for at least 75 minutes each week, or a combination of these. Moderate-intensity exercise can include walking at a quick pace, biking, yoga, water aerobics, or gardening. Vigorous exercise involves activities that take more effort, such as jogging or running, playing sports, swimming laps, or jumping rope. Do strength exercises on at least 2 days each week. This can include weight lifting, body weight exercises, and resistance-band exercises. Consider using a fitness tracker, such as a mobile phone app or a device worn like a watch, that will count the number of steps you take each day. Many people strive to reach 10,000 steps a day. Choosing activities Try to find activities that you enjoy. You are more likely to commit to an exercise routine if it does not feel like a chore. If you have bone or joint problems, choose low-impact exercises, like walking or swimming. Use these tips for being successful with an exercise plan: Find a workout partner for accountability. Join a group or class, such as an aerobics class, cycling class, or sports team. Make family time active. Go for a walk, bike, or swim. Include a variety of exercises each week. Being active in your daily routines Besides your formal exercise plans, you can find ways to do physical activity during your daily routines, such as: Walking or biking  to work or to the store. Taking the stairs instead of the elevator. Parking farther away from the door at work or at the store. Planning walking meetings. Walking around while you are on the  phone.  Where to find more information Centers for Disease Control and Prevention: WorkDashboard.es President's Council on Fitness, Sports & Nutrition: www.fitness.gov ChooseMyPlate: FormerBoss.no Contact a health care provider if: You have headaches, muscle aches, or joint pain. You feel dizzy or light-headed while exercising. You faint. You have chest pain while exercising. Summary Exercise benefits your mind and body at any age, even if you are just starting out. If you have a chronic illness or have not been active for a while, check with your health care provider before increasing your physical activity. Choose activities that are safe and enjoyable for you. Ask your health care provider what activities are safe for you. Start slowly. Tell your health care provider if you have problems as you start to increase your activity level. This information is not intended to replace advice given to you by your health care provider. Make sure you discuss any questions you have with your healthcare provider. Document Revised: 08/12/2020 Document Reviewed: 03/25/2019 Elsevier Patient Education  [place].    Fatigue If you have fatigue, you feel tired all the time and have a lack of energy or a lack of motivation. Fatigue may make it difficult to start or complete tasks because of exhaustion. In general, occasional or mild fatigue is often a normal response to activity or life. However, long-lasting (chronic) or extreme fatigue may be a symptom of a medical condition. Follow these instructions at home: General instructions Watch your fatigue for any changes. Go to bed and get up at the same time every day. Avoid fatigue by pacing yourself during the day and getting enough sleep at night. Maintain a healthy weight. Medicines Take over-the-counter and prescription medicines only as told by your health care provider. Take a multivitamin, if told by your health care  provider.  Do not use herbal or dietary supplements unless they are approved by your health care provider. Activity  Exercise regularly, as told by your health care provider. Use or practice techniques to help you relax, such as yoga, tai chi, meditation, or massage therapy.  Eating and drinking  Avoid heavy meals in the evening. Eat a well-balanced diet, which includes lean proteins, whole grains, plenty of fruits and vegetables, and low-fat dairy products. Avoid consuming too much caffeine. Avoid the use of alcohol. Drink enough fluid to keep your urine pale yellow.  Lifestyle Change situations that cause you stress. Try to keep your work and personal schedule in balance. Do not use any products that contain nicotine or tobacco, such as cigarettes and e-cigarettes. If you need help quitting, ask your health care provider. Do not use drugs. Contact a health care provider if: Your fatigue does not get better. You have a fever. You suddenly lose or gain weight. You have headaches. You have trouble falling asleep or sleeping through the night. You feel angry, guilty, anxious, or sad. You are unable to have a bowel movement (constipation). Your skin is dry. You have swelling in your legs or another part of your body. Get help right away if: You feel confused. Your vision is blurry. You feel faint or you pass out. You have a severe headache. You have severe pain in your abdomen, your back, or the area between your waist and hips (pelvis). You have chest pain,  shortness of breath, or an irregular or fast heartbeat. You are unable to urinate, or you urinate less than normal. You have abnormal bleeding, such as bleeding from the rectum, vagina, nose, lungs, or nipples. You vomit blood. You have thoughts about hurting yourself or others. If you ever feel like you may hurt yourself or others, or have thoughts about taking your own life, get help right away. You can go to your nearest  emergency department or call: Your local emergency services (911 in the [place] A suicide crisis helpline, such as the [place] at (765)009-2993. This is open 24 hours a day. Summary If you have fatigue, you feel tired all the time and have a lack of energy or a lack of motivation. Fatigue may make it difficult to start or complete tasks because of exhaustion. Long-lasting (chronic) or extreme fatigue may be a symptom of a medical condition. Exercise regularly, as told by your health care provider. Change situations that cause you stress. Try to keep your work and personal schedule in balance. This information is not intended to replace advice given to you by your health care provider. Make sure you discuss any questions you have with your healthcare provider. Document Revised: 07/09/2020 Document Reviewed: 07/09/2020 Elsevier Patient Education  2022 Elsevier In

## 2021-03-04 NOTE — Progress Notes (Signed)
Established Patient Office Visit  Subjective:  Patient ID: Lisa Ortega, female    DOB: 26-Feb-1946  Age: 75 y.o. MRN: [id number]  CC:  Chief Complaint  Patient presents with   Follow-up    HPI Lisa Ortega presents for follow-up generalized weakness. She states fatigue has improved. She has increased oral intake. Denies chest pain, dyspnea, diarrhea, or syncope. She recently completed a course of Macrobid. States UTI symptoms have subsided. Denies any falls at home. Saw neurology yesterday. Saw cardiology today. BP 106/60. States she is scheduled to follow-up with specialist for testing.      Past Medical History:  Diagnosis Date   Acute on chronic diastolic (congestive) heart failure (HCC)    Essential (primary) hypertension    Essential hypertension, benign 11/21/2019   Hypertensive chronic kidney disease with stage 1 through stage 4 chronic kidney disease, or unspecified chronic kidney disease    Impaired fasting glucose    Localized edema    Memory loss 03/03/2021   Mixed hyperlipidemia    Morbid (severe) obesity due to excess calories (HCC)    Obesity, Class III, BMI 40-49.9 (morbid obesity) ([place]) 11/23/2019   Other seasonal allergic rhinitis    Other specified nonscarring hair loss    Pain in left knee 01/21/2020   Telogen effluvium     Past Surgical History:  Procedure Laterality Date   TONSILLECTOMY      Family History  Problem Relation Age of Onset   Transient ischemic attack Mother    Alzheimer's disease Mother    Stroke Father    Throat cancer Father    Breast cancer Sister    Pulmonary embolism Brother    Asthma Other    Diabetes type II Other    Hypertension Other    Hypothyroidism Other    Heart attack Other    Migraines Other    Osteoarthritis Other    Breast cancer Niece     Social History   Socioeconomic History   Marital status: Divorced    Spouse name: Not on file   Number of children: 3   Years of education: Not on file    Highest education level: Not on file  Occupational History   Occupation: Environmental health practitioner  Tobacco Use   Smoking status: Never   Smokeless tobacco: Never  Vaping Use   Vaping Use: Never used  Substance and Sexual Activity   Alcohol use: Never   Drug use: Never   Sexual activity: Not on file  Other Topics Concern   Not on file  Social History Narrative   Right handed   One story home   Drinks caffeine   Social Determinants of Health   Financial Resource Strain: Not on file  Food Insecurity: Not on file  Transportation Needs: Not on file  Physical Activity: Not on file  Stress: Not on file  Social Connections: Not on file  Intimate Partner Violence: Not on file    Outpatient Medications Prior to Visit  Medication Sig Dispense Refill   albuterol (VENTOLIN HFA) 108 (90 Base) MCG/ACT inhaler Inhale 1 puff into the lungs every 6 (six) hours as needed for wheezing or shortness of breath.     atenolol (TENORMIN) 50 MG tablet Take 1 tablet by mouth twice daily 180 tablet 1   Cholecalciferol (VITAMIN D3) 50 MCG (2000 UT) capsule Take 2,000 Units by mouth daily.     KLOR-CON M20 20 MEQ tablet Take 20 mEq by mouth daily.  losartan (COZAAR) 25 MG tablet Take 1 tablet by mouth once daily 90 tablet 1   nitrofurantoin, macrocrystal-monohydrate, (MACROBID) 100 MG capsule Take 1 capsule (100 mg total) by mouth 2 (two) times daily. 14 capsule 0   ondansetron (ZOFRAN ODT) 4 MG disintegrating tablet Take 1 tablet (4 mg total) by mouth every 8 (eight) hours as needed for nausea or vomiting. 20 tablet 0   rosuvastatin (CRESTOR) 5 MG tablet Take 1 tablet by mouth once daily 90 tablet 0   furosemide (LASIX) 40 MG tablet Take 1 tablet (40 mg total) by mouth daily. 30 tablet 1   No facility-administered medications prior to visit.    Allergies  Allergen Reactions   Amlodipine Other (See Comments)    Alopecia   Lisinopril-Hydrochlorothiazide     ROS Review of Systems  Constitutional:   Negative for appetite change, fatigue and unexpected weight change.  HENT:  Negative for congestion, ear pain, rhinorrhea, sinus pressure, sinus pain and tinnitus.   Eyes:  Negative for pain.  Respiratory:  Negative for cough and shortness of breath.   Cardiovascular:  Negative for chest pain, palpitations and leg swelling.  Gastrointestinal:  Negative for abdominal pain, constipation, diarrhea, nausea and vomiting.  Endocrine: Negative for cold intolerance, heat intolerance, polydipsia, polyphagia and polyuria.  Genitourinary:  Negative for dysuria, frequency and hematuria.  Musculoskeletal:  Negative for arthralgias, back pain, joint swelling and myalgias.  Skin:  Negative for rash.  Allergic/Immunologic: Negative for environmental allergies.  Neurological:  Negative for dizziness and headaches.  Hematological:  Negative for adenopathy.  Psychiatric/Behavioral:  Negative for decreased concentration and sleep disturbance. The patient is not nervous/anxious.      Objective:    Physical Exam Vitals reviewed.  Constitutional:      Appearance: Normal appearance.  Cardiovascular:     Rate and Rhythm: Normal rate and regular rhythm.     Pulses: Normal pulses.     Heart sounds: Normal heart sounds.  Pulmonary:     Effort: Pulmonary effort is normal.     Breath sounds: Normal breath sounds.  Abdominal:     General: Bowel sounds are normal.     Palpations: Abdomen is soft.  Skin:    General: Skin is warm and dry.     Capillary Refill: Capillary refill takes less than 2 seconds.  Neurological:     General: No focal deficit present.     Mental Status: She is alert and oriented to person, place, and time.  Psychiatric:        Mood and Affect: Mood normal.        Behavior: Behavior normal.   BP 106/60   Pulse 72   Temp (!) 97.5 F (36.4 C)   Resp 14   Ht _0  (1.575 m)   Wt 209 lb (94.8 kg)   BMI 38.23 kg/m  Wt Readings from Last 3 Encounters:  03/04/21 209 lb (94.8 kg)   03/04/21 211 lb 9.6 oz (96 kg)  03/03/21 212 lb (96.2 kg)     Health Maintenance Due  Topic Date Due   COVID-19 Vaccine (1) Never done   TETANUS/TDAP  Never done   Zoster Vaccines- Shingrix (1 of 2) Never done    There are no preventive care reminders to display for this patient.  Lab Results  Component Value Date   TSH 3.09 03/03/2021   Lab Results  Component Value Date   WBC 7.4 11/23/2020   HGB 12.6 11/23/2020   HCT 37.6 11/23/2020  MCV 85 11/23/2020   PLT 225 11/23/2020   Lab Results  Component Value Date   NA 144 11/23/2020   K 5.0 11/23/2020   CO2 27 11/23/2020   GLUCOSE 138 (H) 11/23/2020   BUN 16 11/23/2020   CREATININE 1.25 (H) 11/23/2020   BILITOT 0.5 11/23/2020   ALKPHOS 99 11/23/2020   AST 24 11/23/2020   ALT 14 11/23/2020   PROT 7.4 11/23/2020   ALBUMIN 4.2 11/23/2020   CALCIUM 9.4 11/23/2020   EGFR 45 (L) 11/23/2020   Lab Results  Component Value Date   CHOL 118 11/23/2020   Lab Results  Component Value Date   HDL 52 11/23/2020   Lab Results  Component Value Date   LDLCALC 52 11/23/2020   Lab Results  Component Value Date   TRIG 69 11/23/2020   Lab Results  Component Value Date   CHOLHDL 2.3 11/23/2020   Lab Results  Component Value Date   HGBA1C 6.0 (H) 11/23/2020      Assessment & Plan:   1. Generalized weakness  -continue to push fluids -fall precautions -increase physical activity  Continue medications Keep follow-up appointment with neurology and cardiology as scheduled Fall precautions Increase physical activity Follow-up with Dr Tobie Poet 05/26/21 at 9:00, fasting  Follow-up: PRN  Signed, Rip Harbour, NP

## 2021-03-05 ENCOUNTER — Telehealth (HOSPITAL_COMMUNITY): Payer: Self-pay | Admitting: *Deleted

## 2021-03-05 NOTE — Telephone Encounter (Signed)
Patient's daughter given detailed instructions per Myocardial Perfusion Study Information Sheet for the test on 03/10/21 at 8:15. Patient notified to arrive 15 minutes early and that it is imperative to arrive on time for appointment to keep from having the test rescheduled.  If you need to cancel or reschedule your appointment, please call the office within 24 hours of your appointment. . Patient verbalized understanding.Lisa Ortega

## 2021-03-09 ENCOUNTER — Other Ambulatory Visit: Payer: Self-pay

## 2021-03-09 ENCOUNTER — Telehealth: Payer: Self-pay

## 2021-03-09 MED ORDER — FUROSEMIDE 40 MG PO TABS: 40.0000 mg | ORAL_TABLET | Freq: Every day | ORAL | 1 refills | Status: DC

## 2021-03-09 NOTE — Telephone Encounter (Signed)
New message:       Patient calling to get instructions concerning her apt.

## 2021-03-09 NOTE — Telephone Encounter (Signed)
[facility] called and stated they never received a RX for patient's Lasix to be renewed. Please re-send patient's RX for the lasix to [facility].

## 2021-03-10 ENCOUNTER — Ambulatory Visit (INDEPENDENT_AMBULATORY_CARE_PROVIDER_SITE_OTHER): Payer: Medicare Other

## 2021-03-10 ENCOUNTER — Other Ambulatory Visit: Payer: Self-pay

## 2021-03-10 DIAGNOSIS — R0609 Other forms of dyspnea: Secondary | ICD-10-CM

## 2021-03-10 DIAGNOSIS — R0600 Dyspnea, unspecified: Secondary | ICD-10-CM

## 2021-03-10 LAB — MYOCARDIAL PERFUSION IMAGING
LV dias vol: 53 mL (ref 46–106)
LV sys vol: 13 mL
Peak HR: 95 {beats}/min
Rest HR: 68 {beats}/min
SDS: 2
SRS: 0
SSS: 2
TID: 0.97

## 2021-03-10 MED ORDER — TECHNETIUM TC 99M TETROFOSMIN IV KIT
33.0000 | PACK | Freq: Once | INTRAVENOUS | Status: AC | PRN
  Administered 2021-03-10: 33 via INTRAVENOUS

## 2021-03-10 MED ORDER — REGADENOSON 0.4 MG/5ML IV SOLN
0.4000 mg | Freq: Once | INTRAVENOUS | Status: AC
  Administered 2021-03-10: 0.4 mg via INTRAVENOUS

## 2021-03-10 MED ORDER — TECHNETIUM TC 99M TETROFOSMIN IV KIT
11.0000 | PACK | Freq: Once | INTRAVENOUS | Status: AC | PRN
  Administered 2021-03-10: 11 via INTRAVENOUS

## 2021-03-11 NOTE — Progress Notes (Deleted)
error 

## 2021-03-17 ENCOUNTER — Other Ambulatory Visit: Payer: Self-pay | Admitting: Physician Assistant

## 2021-03-22 ENCOUNTER — Ambulatory Visit
Admission: RE | Admit: 2021-03-22 | Discharge: 2021-03-22 | Disposition: A | Discharge status: Home / Self Care | Payer: Medicare Other | Source: Ambulatory Visit | Attending: Physician Assistant | Admitting: Physician Assistant

## 2021-03-22 ENCOUNTER — Other Ambulatory Visit: Payer: Self-pay

## 2021-03-22 DIAGNOSIS — G319 Degenerative disease of nervous system, unspecified: Secondary | ICD-10-CM | POA: Diagnosis not present

## 2021-03-22 DIAGNOSIS — R413 Other amnesia: Secondary | ICD-10-CM | POA: Diagnosis not present

## 2021-03-22 DIAGNOSIS — I6782 Cerebral ischemia: Secondary | ICD-10-CM | POA: Diagnosis not present

## 2021-03-22 DIAGNOSIS — J329 Chronic sinusitis, unspecified: Secondary | ICD-10-CM | POA: Diagnosis not present

## 2021-03-24 ENCOUNTER — Other Ambulatory Visit: Payer: Self-pay

## 2021-03-24 ENCOUNTER — Ambulatory Visit (INDEPENDENT_AMBULATORY_CARE_PROVIDER_SITE_OTHER): Payer: Medicare Other

## 2021-03-24 DIAGNOSIS — R011 Cardiac murmur, unspecified: Secondary | ICD-10-CM

## 2021-03-24 LAB — ECHOCARDIOGRAM COMPLETE
Area-P 1/2: 5.23 cm2
S' Lateral: 2.55 cm

## 2021-03-24 NOTE — Progress Notes (Signed)
Complete echocardiogram performed.  Jimmy Welda Azzarello RDCS, RVT  

## 2021-03-25 ENCOUNTER — Ambulatory Visit: Payer: Medicare Other

## 2021-03-29 DIAGNOSIS — E876 Hypokalemia: Secondary | ICD-10-CM | POA: Diagnosis not present

## 2021-03-29 DIAGNOSIS — D631 Anemia in chronic kidney disease: Secondary | ICD-10-CM | POA: Diagnosis not present

## 2021-03-29 DIAGNOSIS — N1831 Chronic kidney disease, stage 3a: Secondary | ICD-10-CM | POA: Diagnosis not present

## 2021-03-29 DIAGNOSIS — I129 Hypertensive chronic kidney disease with stage 1 through stage 4 chronic kidney disease, or unspecified chronic kidney disease: Secondary | ICD-10-CM | POA: Diagnosis not present

## 2021-03-29 DIAGNOSIS — E559 Vitamin D deficiency, unspecified: Secondary | ICD-10-CM | POA: Diagnosis not present

## 2021-03-29 DIAGNOSIS — N189 Chronic kidney disease, unspecified: Secondary | ICD-10-CM | POA: Diagnosis not present

## 2021-04-01 DIAGNOSIS — H5203 Hypermetropia, bilateral: Secondary | ICD-10-CM | POA: Diagnosis not present

## 2021-04-01 NOTE — Progress Notes (Signed)
2 D echo grade 2 diastolic function lp

## 2021-05-14 ENCOUNTER — Telehealth: Payer: Self-pay

## 2021-05-14 NOTE — Telephone Encounter (Signed)
I left voicemail to call us back to set up an appointment for AWV.

## 2021-05-26 ENCOUNTER — Ambulatory Visit (INDEPENDENT_AMBULATORY_CARE_PROVIDER_SITE_OTHER): Payer: Medicare Other | Admitting: Family Medicine

## 2021-05-26 ENCOUNTER — Encounter: Payer: Self-pay | Admitting: Family Medicine

## 2021-05-26 ENCOUNTER — Other Ambulatory Visit: Payer: Self-pay

## 2021-05-26 VITALS — BP 106/60 | HR 78 | Temp 97.6°F | Resp 18 | Ht 62.0 in | Wt 213.0 lb

## 2021-05-26 DIAGNOSIS — M170 Bilateral primary osteoarthritis of knee: Secondary | ICD-10-CM

## 2021-05-26 DIAGNOSIS — I129 Hypertensive chronic kidney disease with stage 1 through stage 4 chronic kidney disease, or unspecified chronic kidney disease: Secondary | ICD-10-CM | POA: Diagnosis not present

## 2021-05-26 DIAGNOSIS — R7301 Impaired fasting glucose: Secondary | ICD-10-CM | POA: Diagnosis not present

## 2021-05-26 DIAGNOSIS — Z13820 Encounter for screening for osteoporosis: Secondary | ICD-10-CM | POA: Diagnosis not present

## 2021-05-26 DIAGNOSIS — N1832 Chronic kidney disease, stage 3b: Secondary | ICD-10-CM

## 2021-05-26 DIAGNOSIS — I5032 Chronic diastolic (congestive) heart failure: Secondary | ICD-10-CM

## 2021-05-26 DIAGNOSIS — I10 Essential (primary) hypertension: Secondary | ICD-10-CM

## 2021-05-26 DIAGNOSIS — Z780 Asymptomatic menopausal state: Secondary | ICD-10-CM

## 2021-05-26 DIAGNOSIS — E782 Mixed hyperlipidemia: Secondary | ICD-10-CM

## 2021-05-26 DIAGNOSIS — Z2821 Immunization not carried out because of patient refusal: Secondary | ICD-10-CM

## 2021-05-26 NOTE — Progress Notes (Signed)
Subjective:  Patient ID: Lisa Ortega, female    DOB: 30-Sep-1945  Age: 75 y.o. MRN: [id number]  Chief Complaint  Patient presents with   Hyperlipidemia   Hypertension    HPI PreDiabetes:  Most recent A1C: 6.0 Current medications: none.  Hyperlipidemia: Current medications: Crestor 5 mg daily  Hypertension: Current medications: Atenolol 50 mg daily. Lasix 40 mg daily for edema  Diet: Does not eat healthy.  Exercise: does not exercise. BL knee pain.  Current Outpatient Medications on File Prior to Visit  Medication Sig Dispense Refill   albuterol (VENTOLIN HFA) 108 (90 Base) MCG/ACT inhaler Inhale 1 puff into the lungs every 6 (six) hours as needed for wheezing or shortness of breath.     atenolol (TENORMIN) 50 MG tablet Take 1 tablet by mouth twice daily 180 tablet 1   Cholecalciferol (VITAMIN D3) 50 MCG (2000 UT) capsule Take 2,000 Units by mouth daily.     furosemide (LASIX) 40 MG tablet Take 1 tablet (40 mg total) by mouth daily. 90 tablet 1   KLOR-CON M20 20 MEQ tablet Take 20 mEq by mouth daily.     losartan (COZAAR) 25 MG tablet Take 1 tablet by mouth once daily 90 tablet 1   rosuvastatin (CRESTOR) 5 MG tablet Take 1 tablet by mouth once daily 90 tablet 0   No current facility-administered medications on file prior to visit.   Past Medical History:  Diagnosis Date   Acute on chronic diastolic (congestive) heart failure (HCC)    Essential (primary) hypertension    Essential hypertension, benign 11/21/2019   Hypertensive chronic kidney disease with stage 1 through stage 4 chronic kidney disease, or unspecified chronic kidney disease    Impaired fasting glucose    Localized edema    Memory loss 03/03/2021   Mixed hyperlipidemia    Morbid (severe) obesity due to excess calories (HCC)    Obesity, Class III, BMI 40-49.9 (morbid obesity) ([place]) 11/23/2019   Other seasonal allergic rhinitis    Other specified nonscarring hair loss    Pain in left knee 01/21/2020    Telogen effluvium    Past Surgical History:  Procedure Laterality Date   TONSILLECTOMY      Family History  Problem Relation Age of Onset   Transient ischemic attack Mother    Alzheimer's disease Mother    Stroke Father    Throat cancer Father    Breast cancer Sister    Pulmonary embolism Brother    Asthma Other    Diabetes type II Other    Hypertension Other    Hypothyroidism Other    Heart attack Other    Migraines Other    Osteoarthritis Other    Breast cancer Niece    Social History   Socioeconomic History   Marital status: Divorced    Spouse name: Not on file   Number of children: 3   Years of education: Not on file   Highest education level: Not on file  Occupational History   Occupation: Environmental health practitioner  Tobacco Use   Smoking status: Never   Smokeless tobacco: Never  Vaping Use   Vaping Use: Never used  Substance and Sexual Activity   Alcohol use: Never   Drug use: Never   Sexual activity: Not on file  Other Topics Concern   Not on file  Social History Narrative   Right handed   One story home   Drinks caffeine   Social Determinants of Health   Financial Resource Strain:  Not on file  Food Insecurity: Not on file  Transportation Needs: Not on file  Physical Activity: Not on file  Stress: Not on file  Social Connections: Not on file    Review of Systems  Constitutional:  Negative for chills, fatigue and fever.  HENT:  Negative for congestion, rhinorrhea and sore throat.   Respiratory:  Negative for cough and shortness of breath.   Cardiovascular:  Negative for chest pain.  Gastrointestinal:  Negative for abdominal pain, constipation, diarrhea, nausea and vomiting.  Genitourinary:  Negative for dysuria and urgency.  Musculoskeletal:  Positive for arthralgias (bilateral knee pain) and back pain. Negative for myalgias.  Neurological:  Negative for dizziness, weakness, light-headedness and headaches.  Psychiatric/Behavioral:  Negative for dysphoric  mood. The patient is not nervous/anxious.     Objective:  BP 106/60   Pulse 78   Temp 97.6 F (36.4 C)   Resp 18   Ht '5\' 2"'$  (1.575 m)   Wt 213 lb (96.6 kg)   BMI 38.96 kg/m   BP/Weight 05/26/2021 03/04/2021 [id number]  Systolic BP [id number]  Diastolic BP 60 60 60  Wt. (Lbs) 213 209 211.6  BMI 38.96 38.23 38.7    Physical Exam Vitals reviewed.  Constitutional:      Appearance: Normal appearance. She is obese.  Neck:     Vascular: No carotid bruit.  Cardiovascular:     Rate and Rhythm: Normal rate and regular rhythm.     Heart sounds: Murmur heard.  Pulmonary:     Effort: Pulmonary effort is normal. No respiratory distress.     Breath sounds: Normal breath sounds.  Abdominal:     General: Abdomen is flat. Bowel sounds are normal.     Palpations: Abdomen is soft.     Tenderness: There is no abdominal tenderness.  Musculoskeletal:     Right knee: Normal.     Left knee: Normal.  Neurological:     Mental Status: She is alert and oriented to person, place, and time.  Psychiatric:        Mood and Affect: Mood normal.        Behavior: Behavior normal.    Diabetic Foot Exam - Simple   No data filed      Lab Results  Component Value Date   WBC 7.2 05/26/2021   HGB 12.8 05/26/2021   HCT 39.2 05/26/2021   PLT 194 05/26/2021   GLUCOSE 121 (H) 05/26/2021   CHOL 128 05/26/2021   TRIG 67 05/26/2021   HDL 51 05/26/2021   LDLCALC 63 05/26/2021   ALT 9 05/26/2021   AST 22 05/26/2021   NA 146 (H) 05/26/2021   K 5.1 05/26/2021   CL 105 05/26/2021   CREATININE 1.26 (H) 05/26/2021   BUN 18 05/26/2021   CO2 26 05/26/2021   TSH 3.09 03/03/2021   HGBA1C 6.4 (H) 05/26/2021      Assessment & Plan:   Problem List Items Addressed This Visit       Cardiovascular and Mediastinum   Diastolic dysfunction with chronic heart failure ([place])    The current medical regimen is effective;  continue present plan and medications.         Endocrine   Impaired fasting  glucose    Recommend continue to work on eating healthy diet and exercise.       Relevant Orders   Hemoglobin A1c (Completed)     Musculoskeletal and Integument   Osteoarthritis of both knees    Management  per specialist.          Genitourinary   Hypertensive kidney disease with stage 3b chronic kidney disease ([place]) - Primary    Well controlled.  No changes to medicines.  Continue to work on eating a healthy diet and exercise.  Labs drawn today.          Other   Mixed hyperlipidemia    Well controlled.  No changes to medicines.  Continue to work on eating a healthy diet and exercise.  Labs drawn today.        Relevant Orders   CBC with Differential/Platelet (Completed)   Comprehensive metabolic panel (Completed)   Lipid panel (Completed)   Morbid (severe) obesity due to excess calories ([place])    Recommend continue to work on eating healthy diet and exercise.       Other Visit Diagnoses     Encounter for osteoporosis screening in asymptomatic postmenopausal patient       Relevant Orders   DG Bone Density   Influenza vaccine refused       COVID-19 vaccination refused         . Orders Placed This Encounter  Procedures   DG Bone Density   CBC with Differential/Platelet   Hemoglobin A1c   Comprehensive metabolic panel   Lipid panel   Cardiovascular Risk Assessment    Follow-up: Return in about 6 months (around 11/23/2021) for chronic fasting. Patient refuses to return for AWV.  An After Visit Summary was printed and given to the patient.  Rochel Brome, MD [facility] [phone]

## 2021-05-26 NOTE — Assessment & Plan Note (Signed)
Well controlled.  ?No changes to medicines.  ?Continue to work on eating a healthy diet and exercise.  ?Labs drawn today.  ?

## 2021-05-26 NOTE — Assessment & Plan Note (Signed)
Recommend continue to work on eating healthy diet and exercise.  

## 2021-05-26 NOTE — Assessment & Plan Note (Signed)
Management per specialist. 

## 2021-05-27 LAB — COMPREHENSIVE METABOLIC PANEL
ALT: 9 IU/L (ref 0–32)
AST: 22 IU/L (ref 0–40)
Albumin/Globulin Ratio: 1.5 (ref 1.2–2.2)
Albumin: 4.4 g/dL (ref 3.7–4.7)
Alkaline Phosphatase: 97 IU/L (ref 44–121)
BUN/Creatinine Ratio: 14 (ref 12–28)
BUN: 18 mg/dL (ref 8–27)
Bilirubin Total: 0.5 mg/dL (ref 0.0–1.2)
CO2: 26 mmol/L (ref 20–29)
Calcium: 9.1 mg/dL (ref 8.7–10.3)
Chloride: 105 mmol/L (ref 96–106)
Creatinine, Ser: 1.26 mg/dL — ABNORMAL HIGH (ref 0.57–1.00)
Globulin, Total: 3 g/dL (ref 1.5–4.5)
Glucose: 121 mg/dL — ABNORMAL HIGH (ref 65–99)
Potassium: 5.1 mmol/L (ref 3.5–5.2)
Sodium: 146 mmol/L — ABNORMAL HIGH (ref 134–144)
Total Protein: 7.4 g/dL (ref 6.0–8.5)
eGFR: 44 mL/min/{1.73_m2} — ABNORMAL LOW (ref >59)

## 2021-05-27 LAB — LIPID PANEL
Chol/HDL Ratio: 2.5 ratio (ref 0.0–4.4)
Cholesterol, Total: 128 mg/dL (ref 100–199)
HDL: 51 mg/dL (ref >39)
LDL Chol Calc (NIH): 63 mg/dL (ref 0–99)
Triglycerides: 67 mg/dL (ref 0–149)
VLDL Cholesterol Cal: 14 mg/dL (ref 5–40)

## 2021-05-27 LAB — CBC WITH DIFFERENTIAL/PLATELET
Basophils Absolute: 0.1 10*3/uL (ref 0.0–0.2)
Basos: 1 %
EOS (ABSOLUTE): 0.1 10*3/uL (ref 0.0–0.4)
Eos: 1 %
Hematocrit: 39.2 % (ref 34.0–46.6)
Hemoglobin: 12.8 g/dL (ref 11.1–15.9)
Immature Grans (Abs): 0 10*3/uL (ref 0.0–0.1)
Immature Granulocytes: 0 %
Lymphocytes Absolute: 2.4 10*3/uL (ref 0.7–3.1)
Lymphs: 33 %
MCH: 28.4 pg (ref 26.6–33.0)
MCHC: 32.7 g/dL (ref 31.5–35.7)
MCV: 87 fL (ref 79–97)
Monocytes Absolute: 0.5 10*3/uL (ref 0.1–0.9)
Monocytes: 8 %
Neutrophils Absolute: 4.1 10*3/uL (ref 1.4–7.0)
Neutrophils: 57 %
Platelets: 194 10*3/uL (ref 150–450)
RBC: 4.5 x10E6/uL (ref 3.77–5.28)
RDW: 12.9 % (ref 11.7–15.4)
WBC: 7.2 10*3/uL (ref 3.4–10.8)

## 2021-05-27 LAB — HEMOGLOBIN A1C
Est. average glucose Bld gHb Est-mCnc: 137 mg/dL
Hgb A1c MFr Bld: 6.4 % — ABNORMAL HIGH (ref 4.8–5.6)

## 2021-05-27 LAB — CARDIOVASCULAR RISK ASSESSMENT

## 2021-05-31 NOTE — Assessment & Plan Note (Signed)
The current medical regimen is effective;  continue present plan and medications.  

## 2021-05-31 NOTE — Assessment & Plan Note (Signed)
Well controlled.  ?No changes to medicines.  ?Continue to work on eating a healthy diet and exercise.  ?Labs drawn today.  ?

## 2021-06-02 ENCOUNTER — Telehealth: Payer: Self-pay | Admitting: Family Medicine

## 2021-06-02 NOTE — Telephone Encounter (Signed)
   Lisa Ortega has been scheduled for the following appointment:  WHAT: BONE DENSITY WHERE: [facility] DATE: 07/28/21 TIME: 7:30 AM ARRIVAL TIME  A message has been left for the patient.

## 2021-06-09 ENCOUNTER — Encounter: Payer: Medicare Other | Admitting: Counselor

## 2021-06-15 ENCOUNTER — Other Ambulatory Visit: Payer: Self-pay

## 2021-06-15 MED ORDER — ROSUVASTATIN CALCIUM 5 MG PO TABS: 5.0000 mg | ORAL_TABLET | Freq: Every day | ORAL | 0 refills | Status: DC

## 2021-06-15 NOTE — Telephone Encounter (Signed)
Refill sent to pharmacy.   

## 2021-06-17 ENCOUNTER — Encounter: Payer: Medicare Other | Admitting: Counselor

## 2021-06-24 ENCOUNTER — Telehealth: Payer: Self-pay

## 2021-06-24 NOTE — Telephone Encounter (Signed)
Lisa Ortega called to confirm that she received her appointment for the DEXA scan.

## 2021-07-12 ENCOUNTER — Other Ambulatory Visit: Payer: Self-pay

## 2021-07-12 MED ORDER — ATENOLOL 50 MG PO TABS: 50.0000 mg | ORAL_TABLET | Freq: Two times a day (BID) | ORAL | 1 refills | Status: DC

## 2021-07-26 ENCOUNTER — Other Ambulatory Visit: Payer: Self-pay

## 2021-07-26 MED ORDER — LOSARTAN POTASSIUM 25 MG PO TABS: 25.0000 mg | ORAL_TABLET | Freq: Every day | ORAL | 1 refills | Status: DC

## 2021-07-28 DIAGNOSIS — M8580 Other specified disorders of bone density and structure, unspecified site: Secondary | ICD-10-CM | POA: Diagnosis not present

## 2021-07-28 DIAGNOSIS — N959 Unspecified menopausal and perimenopausal disorder: Secondary | ICD-10-CM | POA: Diagnosis not present

## 2021-07-28 DIAGNOSIS — M85851 Other specified disorders of bone density and structure, right thigh: Secondary | ICD-10-CM | POA: Diagnosis not present

## 2021-07-28 LAB — HM DEXA SCAN: HM Dexa Scan: ABNORMAL

## 2021-08-02 ENCOUNTER — Ambulatory Visit: Payer: Medicare Other | Admitting: Physician Assistant

## 2021-08-31 ENCOUNTER — Other Ambulatory Visit: Payer: Self-pay

## 2021-08-31 MED ORDER — FUROSEMIDE 40 MG PO TABS: 40.0000 mg | ORAL_TABLET | Freq: Every day | ORAL | 1 refills | Status: DC

## 2021-09-14 ENCOUNTER — Other Ambulatory Visit: Payer: Self-pay

## 2021-09-14 ENCOUNTER — Other Ambulatory Visit: Payer: Self-pay | Admitting: Nurse Practitioner

## 2021-09-14 MED ORDER — ROSUVASTATIN CALCIUM 5 MG PO TABS: 5.0000 mg | ORAL_TABLET | Freq: Every day | ORAL | 0 refills | Status: DC

## 2021-09-14 MED ORDER — FUROSEMIDE 40 MG PO TABS: 40.0000 mg | ORAL_TABLET | Freq: Every day | ORAL | 1 refills | Status: DC

## 2021-09-30 DIAGNOSIS — N189 Chronic kidney disease, unspecified: Secondary | ICD-10-CM | POA: Diagnosis not present

## 2021-09-30 DIAGNOSIS — N1831 Chronic kidney disease, stage 3a: Secondary | ICD-10-CM | POA: Diagnosis not present

## 2021-10-06 DIAGNOSIS — D631 Anemia in chronic kidney disease: Secondary | ICD-10-CM | POA: Diagnosis not present

## 2021-10-06 DIAGNOSIS — N1831 Chronic kidney disease, stage 3a: Secondary | ICD-10-CM | POA: Diagnosis not present

## 2021-10-06 DIAGNOSIS — I129 Hypertensive chronic kidney disease with stage 1 through stage 4 chronic kidney disease, or unspecified chronic kidney disease: Secondary | ICD-10-CM | POA: Diagnosis not present

## 2021-10-06 DIAGNOSIS — E876 Hypokalemia: Secondary | ICD-10-CM | POA: Diagnosis not present

## 2021-10-19 ENCOUNTER — Other Ambulatory Visit: Payer: Self-pay | Admitting: Family Medicine

## 2021-10-19 DIAGNOSIS — Z1231 Encounter for screening mammogram for malignant neoplasm of breast: Secondary | ICD-10-CM

## 2021-11-23 ENCOUNTER — Ambulatory Visit (INDEPENDENT_AMBULATORY_CARE_PROVIDER_SITE_OTHER): Payer: Medicare Other | Admitting: Family Medicine

## 2021-11-23 ENCOUNTER — Other Ambulatory Visit: Payer: Self-pay

## 2021-11-23 ENCOUNTER — Encounter: Payer: Self-pay | Admitting: Family Medicine

## 2021-11-23 VITALS — BP 122/72 | HR 79 | Temp 98.9°F | Ht 62.0 in | Wt 220.0 lb

## 2021-11-23 DIAGNOSIS — I129 Hypertensive chronic kidney disease with stage 1 through stage 4 chronic kidney disease, or unspecified chronic kidney disease: Secondary | ICD-10-CM | POA: Diagnosis not present

## 2021-11-23 DIAGNOSIS — M170 Bilateral primary osteoarthritis of knee: Secondary | ICD-10-CM

## 2021-11-23 DIAGNOSIS — N1832 Chronic kidney disease, stage 3b: Secondary | ICD-10-CM

## 2021-11-23 DIAGNOSIS — E782 Mixed hyperlipidemia: Secondary | ICD-10-CM

## 2021-11-23 DIAGNOSIS — R7301 Impaired fasting glucose: Secondary | ICD-10-CM | POA: Diagnosis not present

## 2021-11-23 DIAGNOSIS — I5032 Chronic diastolic (congestive) heart failure: Secondary | ICD-10-CM

## 2021-11-23 MED ORDER — FUROSEMIDE 40 MG PO TABS: 20.0000 mg | ORAL_TABLET | Freq: Every day | ORAL | 0 refills | Status: DC

## 2021-11-23 NOTE — Assessment & Plan Note (Signed)
Continue lasix 40 mg 1/2 daily and kcl 20 meq 1/2 daily. ?

## 2021-11-23 NOTE — Assessment & Plan Note (Signed)
Await labs/testing for assessment and recommendations. ?Recommend check sugars fasting daily. ?Recommend check feet daily. ?Recommend annual eye exams. ?Medicines: none currently ?Continue to work on eating a healthy diet and exercise.  ?Labs drawn today.   ? ?

## 2021-11-23 NOTE — Assessment & Plan Note (Signed)
Well controlled.  ?No changes to medicines. Continue atenolol and lasix. Continue potassium. ?Continue to work on eating a healthy diet and exercise.  ?Labs drawn today.  ? ?

## 2021-11-23 NOTE — Patient Instructions (Signed)
Recommend shingrix vaccine series ?Recommend Tetanus (TDAP) ? ?Diabetic recommendations: Visits with PCP every 3-6 months depending on Hemoglobin A1C control (goal is < 7.0.) ?Dietary avoidance/limitation of sugar and carbohydrates, fat, and salt. ?Exercise (aerobic) 3-5 days per week.  ?Kidney Care:  Urine microalbumin (protein) should be checked at least annually. To treat or prevent nephropathy (leaking of protein in urine) - all patients should be on an ACE or an ARB Medicines. ?Heart Disease Prevention: All diabetics should be on a statin cholesterol medicine regardless of cholesterol levels. If cholesterol levels are high, recommend treat to goal of LDL cholesterol level of less than 70. ?Diabetic Eye Care: See an eye doctor at least annually. More if recommended by the eye doctor.  ?Foot Care: Check feet visually daily. ?Blood pressure (hypertension) control < 130/85 ?Vaccinations: annual flu shot, pneumovax 23, tetanus (tdap) ?  ?

## 2021-11-23 NOTE — Assessment & Plan Note (Signed)
Well controlled.  No changes to medicines. Continue rosuvastatin 5 mg before bed.  Continue to work on eating a healthy diet and exercise.  Labs drawn today.   

## 2021-11-23 NOTE — Progress Notes (Signed)
? ?Subjective:  ?Patient ID: Lisa Ortega, female    DOB: Jan 23, 1946  Age: 76 y.o. MRN: [id number] ? ?Chief Complaint  ?Patient presents with  ? Hyperlipidemia  ? Hypertension  ? ?HPI: ?Hyperlipidemia:  ?Patient is currently taking Rosuvastatin 5 mg take 1 tablet daily.  ? ?Hypertension with CKD: ?Patient is currently taking Atenolol 50 mg take 1 tablet BID, Losartan 25 mg take 1 tablet daily. ? ?Hypokalemia: Taking potassium chloride 20 meq 1/2 daily. Patient saw Dr. Gara Kroner, nephrologist at [facility] .  ? ?Prediabetes: ?Last A1c: 6.4 ? ?Does not eat healthy or exercise.  ? ?Memory loss: saw neurology. MRI of brain showed mild atrophy and some vascular changes.  ? ?  ?Current Outpatient Medications on File Prior to Visit  ?Medication Sig Dispense Refill  ? albuterol (VENTOLIN HFA) 108 (90 Base) MCG/ACT inhaler Inhale 1 puff into the lungs every 6 (six) hours as needed for wheezing or shortness of breath.    ? atenolol (TENORMIN) 50 MG tablet Take 1 tablet (50 mg total) by mouth 2 (two) times daily. 180 tablet 1  ? Cholecalciferol (VITAMIN D3) 50 MCG (2000 UT) capsule Take 2,000 Units by mouth daily.    ? KLOR-CON M20 20 MEQ tablet Take 20 mEq by mouth daily.    ? losartan (COZAAR) 25 MG tablet Take 1 tablet (25 mg total) by mouth daily. 90 tablet 1  ? rosuvastatin (CRESTOR) 5 MG tablet Take 1 tablet (5 mg total) by mouth daily. 90 tablet 0  ? ?No current facility-administered medications on file prior to visit.  ? ?Past Medical History:  ?Diagnosis Date  ? Acute on chronic diastolic (congestive) heart failure ([place])   ? Essential (primary) hypertension   ? Essential hypertension, benign 11/21/2019  ? Hypertensive chronic kidney disease with stage 1 through stage 4 chronic kidney disease, or unspecified chronic kidney disease   ? Impaired fasting glucose   ? Localized edema   ? Memory loss 03/03/2021  ? Mixed hyperlipidemia   ? Morbid (severe) obesity due to excess calories ([place])   ? Obesity, Class III, BMI  40-49.9 (morbid obesity) ([place]) 11/23/2019  ? Other seasonal allergic rhinitis   ? Other specified nonscarring hair loss   ? Pain in left knee 01/21/2020  ? Telogen effluvium   ? ?Past Surgical History:  ?Procedure Laterality Date  ? TONSILLECTOMY    ?  ?Family History  ?Problem Relation Age of Onset  ? Transient ischemic attack Mother   ? Alzheimer's disease Mother   ? Stroke Father   ? Throat cancer Father   ? Breast cancer Sister   ? Pulmonary embolism Brother   ? Asthma Other   ? Diabetes type II Other   ? Hypertension Other   ? Hypothyroidism Other   ? Heart attack Other   ? Migraines Other   ? Osteoarthritis Other   ? Breast cancer Niece   ? ?Social History  ? ?Socioeconomic History  ? Marital status: Divorced  ?  Spouse name: Not on file  ? Number of children: 3  ? Years of education: Not on file  ? Highest education level: Not on file  ?Occupational History  ? Occupation: Environmental health practitioner  ?Tobacco Use  ? Smoking status: Never  ? Smokeless tobacco: Never  ?Vaping Use  ? Vaping Use: Never used  ?Substance and Sexual Activity  ? Alcohol use: Never  ? Drug use: Never  ? Sexual activity: Not on file  ?Other Topics Concern  ? Not  on file  ?Social History Narrative  ? Right handed  ? One story home  ? Drinks caffeine  ? ?Social Determinants of Health  ? ?Financial Resource Strain: Not on file  ?Food Insecurity: Not on file  ?Transportation Needs: Not on file  ?Physical Activity: Not on file  ?Stress: Not on file  ?Social Connections: Not on file  ? ? ?Review of Systems  ?Constitutional:  Negative for appetite change, fatigue and fever.  ?HENT:  Negative for congestion, ear pain, sinus pressure and sore throat.   ?Respiratory:  Negative for cough, chest tightness, shortness of breath and wheezing.   ?Cardiovascular:  Negative for chest pain and palpitations.  ?Gastrointestinal:  Negative for abdominal pain, constipation, diarrhea, nausea and vomiting.  ?Genitourinary:  Negative for dysuria and hematuria.   ?Musculoskeletal:  Negative for arthralgias, back pain, joint swelling and myalgias.  ?Skin:  Negative for rash.  ?Neurological:  Negative for dizziness, weakness and headaches.  ?Psychiatric/Behavioral:  Negative for dysphoric mood. The patient is not nervous/anxious.   ? ? ?Objective:  ?BP 122/72 (BP Location: Left Arm, Patient Position: Sitting)   Pulse 79   Temp 98.9 ?F (37.2 ?C) (Oral)   Ht '5\' 2"'$  (1.575 m)   Wt 220 lb (99.8 kg)   SpO2 97%   BMI 40.24 kg/m?  ? ?BP/Weight 11/23/2021 05/26/2021 03/04/2021  ?Systolic BP 762 263 335  ?Diastolic BP 72 60 60  ?Wt. (Lbs) 220 213 209  ?BMI 40.24 38.96 38.23  ? ? ?Physical Exam ?Vitals reviewed.  ?Constitutional:   ?   Appearance: Normal appearance. She is obese.  ?Neck:  ?   Vascular: No carotid bruit.  ?Cardiovascular:  ?   Rate and Rhythm: Normal rate and regular rhythm.  ?   Heart sounds: Normal heart sounds.  ?Pulmonary:  ?   Effort: Pulmonary effort is normal. No respiratory distress.  ?   Breath sounds: Normal breath sounds.  ?Abdominal:  ?   General: Abdomen is flat. Bowel sounds are normal.  ?   Palpations: Abdomen is soft.  ?   Tenderness: There is no abdominal tenderness.  ?Neurological:  ?   Mental Status: She is alert and oriented to person, place, and time.  ?Psychiatric:     ?   Mood and Affect: Mood normal.     ?   Behavior: Behavior normal.  ? ? ?Diabetic Foot Exam - Simple   ?Simple Foot Form ?Diabetic Foot exam was performed with the following findings: Yes 11/23/2021  9:45 AM  ?Visual Inspection ?No deformities, no ulcerations, no other skin breakdown bilaterally: Yes ?Sensation Testing ?Intact to touch and monofilament testing bilaterally: Yes ?Pulse Check ?Posterior Tibialis and Dorsalis pulse intact bilaterally: Yes ?Comments ?  ?  ? ?Lab Results  ?Component Value Date  ? WBC 7.8 11/23/2021  ? HGB 12.8 11/23/2021  ? HCT 38.5 11/23/2021  ? PLT 226 11/23/2021  ? GLUCOSE 140 (H) 11/23/2021  ? CHOL 137 11/23/2021  ? TRIG 62 11/23/2021  ? HDL 54  11/23/2021  ? [place] 70 11/23/2021  ? ALT 12 11/23/2021  ? AST 22 11/23/2021  ? NA 145 (H) 11/23/2021  ? K 4.9 11/23/2021  ? CL 105 11/23/2021  ? CREATININE 1.29 (H) 11/23/2021  ? BUN 18 11/23/2021  ? CO2 27 11/23/2021  ? TSH 3.09 03/03/2021  ? HGBA1C 6.2 (H) 11/23/2021  ? ? ? ? ?Assessment & Plan:  ? ?Problem List Items Addressed This Visit   ? ?  ? Cardiovascular and  Mediastinum  ? Diastolic dysfunction with chronic heart failure ([place])  ?  Continue lasix 40 mg 1/2 daily and kcl 20 meq 1/2 daily. ?  ?  ? Relevant Medications  ? furosemide (LASIX) 40 MG tablet  ?  ? Endocrine  ? Impaired fasting glucose  ?  Await labs/testing for assessment and recommendations. ?Recommend check sugars fasting daily. ?Recommend check feet daily. ?Recommend annual eye exams. ?Medicines: none currently ?Continue to work on eating a healthy diet and exercise.  ?Labs drawn today.   ? ?  ?  ? Relevant Orders  ? Hemoglobin A1c (Completed)  ? Microalbumin / creatinine urine ratio  ?  ? Musculoskeletal and Integument  ? Osteoarthritis of both knees  ?  Recommend tylenol ?  ?  ?  ? Genitourinary  ? Hypertensive kidney disease with stage 3b chronic kidney disease ([place]) - Primary  ?  Well controlled.  ?No changes to medicines. Continue atenolol and lasix. Continue potassium. ?Continue to work on eating a healthy diet and exercise.  ?Labs drawn today.  ? ?  ?  ? Relevant Orders  ? CBC With Diff/Platelet (Completed)  ? Comprehensive metabolic panel (Completed)  ?  ? Other  ? Mixed hyperlipidemia  ?  Well controlled.  ?No changes to medicines. Continue rosuvastatin 5 mg before bed.  ?Continue to work on eating a healthy diet and exercise.  ?Labs drawn today.  ? ?  ?  ? Relevant Medications  ? furosemide (LASIX) 40 MG tablet  ? Other Relevant Orders  ? Lipid panel (Completed)  ? Morbid (severe) obesity due to excess calories ([place])  ?  Recommend continue to work on eating healthy diet and exercise. ? ?  ?  ?. ? ?Meds ordered this encounter   ?Medications  ? furosemide (LASIX) 40 MG tablet  ?  Sig: Take 0.5 tablets (20 mg total) by mouth daily.  ?  Dispense:  1 tablet  ?  Refill:  0  ? ? ?Orders Placed This Encounter  ?Procedures  ? CBC With Diff/Platelet  ? Comprehensive

## 2021-11-23 NOTE — Assessment & Plan Note (Signed)
Recommend continue to work on eating healthy diet and exercise.  

## 2021-11-24 LAB — COMPREHENSIVE METABOLIC PANEL
ALT: 12 IU/L (ref 0–32)
AST: 22 IU/L (ref 0–40)
Albumin/Globulin Ratio: 1.4 (ref 1.2–2.2)
Albumin: 4.4 g/dL (ref 3.7–4.7)
Alkaline Phosphatase: 103 IU/L (ref 44–121)
BUN/Creatinine Ratio: 14 (ref 12–28)
BUN: 18 mg/dL (ref 8–27)
Bilirubin Total: 0.5 mg/dL (ref 0.0–1.2)
CO2: 27 mmol/L (ref 20–29)
Calcium: 9.4 mg/dL (ref 8.7–10.3)
Chloride: 105 mmol/L (ref 96–106)
Creatinine, Ser: 1.29 mg/dL — ABNORMAL HIGH (ref 0.57–1.00)
Globulin, Total: 3.2 g/dL (ref 1.5–4.5)
Glucose: 140 mg/dL — ABNORMAL HIGH (ref 70–99)
Potassium: 4.9 mmol/L (ref 3.5–5.2)
Sodium: 145 mmol/L — ABNORMAL HIGH (ref 134–144)
Total Protein: 7.6 g/dL (ref 6.0–8.5)
eGFR: 43 mL/min/{1.73_m2} — ABNORMAL LOW (ref >59)

## 2021-11-24 LAB — LIPID PANEL
Chol/HDL Ratio: 2.5 ratio (ref 0.0–4.4)
Cholesterol, Total: 137 mg/dL (ref 100–199)
HDL: 54 mg/dL (ref >39)
LDL Chol Calc (NIH): 70 mg/dL (ref 0–99)
Triglycerides: 62 mg/dL (ref 0–149)
VLDL Cholesterol Cal: 13 mg/dL (ref 5–40)

## 2021-11-24 LAB — CBC WITH DIFF/PLATELET
Basophils Absolute: 0.1 10*3/uL (ref 0.0–0.2)
Basos: 1 %
EOS (ABSOLUTE): 0.1 10*3/uL (ref 0.0–0.4)
Eos: 1 %
Hematocrit: 38.5 % (ref 34.0–46.6)
Hemoglobin: 12.8 g/dL (ref 11.1–15.9)
Immature Grans (Abs): 0 10*3/uL (ref 0.0–0.1)
Immature Granulocytes: 0 %
Lymphocytes Absolute: 2.3 10*3/uL (ref 0.7–3.1)
Lymphs: 29 %
MCH: 28.6 pg (ref 26.6–33.0)
MCHC: 33.2 g/dL (ref 31.5–35.7)
MCV: 86 fL (ref 79–97)
Monocytes Absolute: 0.6 10*3/uL (ref 0.1–0.9)
Monocytes: 8 %
Neutrophils Absolute: 4.8 10*3/uL (ref 1.4–7.0)
Neutrophils: 61 %
Platelets: 226 10*3/uL (ref 150–450)
RBC: 4.48 x10E6/uL (ref 3.77–5.28)
RDW: 12.2 % (ref 11.7–15.4)
WBC: 7.8 10*3/uL (ref 3.4–10.8)

## 2021-11-24 LAB — HEMOGLOBIN A1C
Est. average glucose Bld gHb Est-mCnc: 131 mg/dL
Hgb A1c MFr Bld: 6.2 % — ABNORMAL HIGH (ref 4.8–5.6)

## 2021-11-25 NOTE — Assessment & Plan Note (Signed)
Recommend tylenol.  

## 2021-12-02 ENCOUNTER — Ambulatory Visit
Admission: RE | Admit: 2021-12-02 | Discharge: 2021-12-02 | Disposition: A | Discharge status: Home / Self Care | Payer: Medicare Other | Source: Ambulatory Visit | Attending: Family Medicine | Admitting: Family Medicine

## 2021-12-02 DIAGNOSIS — Z1231 Encounter for screening mammogram for malignant neoplasm of breast: Secondary | ICD-10-CM

## 2021-12-14 ENCOUNTER — Ambulatory Visit (INDEPENDENT_AMBULATORY_CARE_PROVIDER_SITE_OTHER): Payer: Medicare Other

## 2021-12-14 DIAGNOSIS — Z0000 Encounter for general adult medical examination without abnormal findings: Secondary | ICD-10-CM

## 2021-12-14 NOTE — Patient Instructions (Signed)

## 2021-12-14 NOTE — Progress Notes (Signed)
? ?Subjective:  ? Lisa Ortega is a 76 y.o. female who presents for Medicare Annual (Subsequent) preventive examination.  I connected with  Lisa Ortega on 12/14/21 by a audio enabled telemedicine application and verified that I am speaking with the correct person using two identifiers. ? ?Patient Location: Home ? ?Provider Location: Office/Clinic ? ?I discussed the limitations of evaluation and management by telemedicine. The patient expressed understanding and agreed to proceed.  ? ?Cardiac Risk Factors include: advanced age (>24mn, >>43women);dyslipidemia;hypertension;obesity (BMI >30kg/m2);sedentary lifestyle ? ?   ?Objective:  ?  ?Today's Vitals  ? 12/14/21 1411  ?PainSc: 0-No pain  ? ?There is no height or weight on file to calculate BMI. ? ?   ? View : No data to display.  ?  ?  ?  ? ? ?Current Medications (verified) ?Outpatient Encounter Medications as of 12/14/2021  ?Medication Sig  ? albuterol (VENTOLIN HFA) 108 (90 Base) MCG/ACT inhaler Inhale 1 puff into the lungs every 6 (six) hours as needed for wheezing or shortness of breath.  ? atenolol (TENORMIN) 50 MG tablet Take 1 tablet (50 mg total) by mouth 2 (two) times daily.  ? Cholecalciferol (VITAMIN D3) 50 MCG (2000 UT) capsule Take 2,000 Units by mouth daily.  ? furosemide (LASIX) 40 MG tablet Take 0.5 tablets (20 mg total) by mouth daily.  ? KLOR-CON M20 20 MEQ tablet Take 20 mEq by mouth daily.  ? losartan (COZAAR) 25 MG tablet Take 1 tablet (25 mg total) by mouth daily.  ? rosuvastatin (CRESTOR) 5 MG tablet Take 1 tablet (5 mg total) by mouth daily.  ? ?No facility-administered encounter medications on file as of 12/14/2021.  ? ? ?Allergies (verified) ?Amlodipine and Lisinopril-hydrochlorothiazide  ? ?History: ?Past Medical History:  ?Diagnosis Date  ? Acute on chronic diastolic (congestive) heart failure ([place]   ? Essential (primary) hypertension   ? Essential hypertension, benign 11/21/2019  ? Hypertensive chronic kidney disease with stage 1  through stage 4 chronic kidney disease, or unspecified chronic kidney disease   ? Impaired fasting glucose   ? Localized edema   ? Memory loss 03/03/2021  ? Mixed hyperlipidemia   ? Morbid (severe) obesity due to excess calories ([facility]   ? Obesity, Class III, BMI 40-49.9 (morbid obesity) ([facility] 11/23/2019  ? Other seasonal allergic rhinitis   ? Other specified nonscarring hair loss   ? Pain in left knee 01/21/2020  ? Telogen effluvium   ? ?Past Surgical History:  ?Procedure Laterality Date  ? TONSILLECTOMY    ? ?Family History  ?Problem Relation Age of Onset  ? Transient ischemic attack Mother   ? Alzheimer's disease Mother   ? Stroke Father   ? Throat cancer Father   ? Breast cancer Sister   ? Pulmonary embolism Brother   ? Asthma Other   ? Diabetes type II Other   ? Hypertension Other   ? Hypothyroidism Other   ? Heart attack Other   ? Migraines Other   ? Osteoarthritis Other   ? Breast cancer Niece   ? ?Social History  ? ?Socioeconomic History  ? Marital status: Divorced  ?  Spouse name: Not on file  ? Number of children: 3  ? Years of education: Not on file  ? Highest education level: Not on file  ?Occupational History  ? Occupation: AEnvironmental health practitioner ?Tobacco Use  ? Smoking status: Never  ? Smokeless tobacco: Never  ?Vaping Use  ? Vaping Use: Never used  ?Substance and  Sexual Activity  ? Alcohol use: Never  ? Drug use: Never  ? Sexual activity: Not Currently  ?  Birth control/protection: Post-menopausal  ?Other Topics Concern  ? Not on file  ?Social History Narrative  ? 3 daughters, 5 grandchildren, 5 great-grandchildren  ? Right handed  ? One story home, couple of steps at door  ? Drinks caffeine  ? ?Social Determinants of Health  ? ?Financial Resource Strain: Not on file  ?Food Insecurity: No Food Insecurity  ? Worried About Charity fundraiser in the Last Year: Never true  ? Ran Out of Food in the Last Year: Never true  ?Transportation Needs: Not on file  ?Physical Activity: Inactive  ? Days of Exercise per Week: 0  days  ? Minutes of Exercise per Session: 0 min  ?Stress: Not on file  ?Social Connections: Not on file  ? ? ?Tobacco Counseling ?Counseling given: Not Answered ? ? ?Clinical Intake: ? ?Pre-visit preparation completed: Yes ?Pain : No/denies pain ?Pain Score: 0-No pain ?  ?BMI - recorded: 40.24 ?Nutritional Status: BMI > 30  Obese ?Diabetes: No ?How often do you need to have someone help you when you read instructions, pamphlets, or other written materials from your doctor or pharmacy?: 3 - Sometimes ?Interpreter Needed?: No ?  ? ? ?Activities of Daily Living ? ?  12/14/2021  ?  2:28 PM  ?In your present state of health, do you have any difficulty performing the following activities:  ?Hearing? 0  ?Vision? 0  ?Difficulty concentrating or making decisions? 0  ?Walking or climbing stairs? 0  ?Dressing or bathing? 0  ?Doing errands, shopping? 0  ?Preparing Food and eating ? N  ?Using the Toilet? N  ?In the past six months, have you accidently leaked urine? Y  ?Do you have problems with loss of bowel control? N  ?Managing your Medications? N  ?Managing your Finances? N  ?Housekeeping or managing your Housekeeping? N  ? ? ?Patient Care Team: ?CoxElnita Maxwell, MD as PCP - General (Family Medicine) ?Rosita Fire, MD as Consulting Physician (Nephrology) ? ?   ?Assessment:  ? This is a routine wellness examination for Lisa Ortega. ? ?Hearing/Vision screen ?No results found. ? ?Dietary issues and exercise activities discussed: ?Current Exercise Habits: The patient does not participate in regular exercise at present, Exercise limited by: None identified ? ? ?Depression Screen ? ?  12/14/2021  ?  2:13 PM 05/26/2021  ?  9:17 AM 11/23/2020  ?  9:03 AM 11/23/2019  ?  8:16 PM  ?PHQ 2/9 Scores  ?PHQ - 2 Score 0 0 0 0  ?  ?Fall Risk ? ?  12/14/2021  ?  2:28 PM 05/26/2021  ?  9:17 AM 11/23/2020  ?  9:03 AM 11/23/2019  ?  8:16 PM  ?Fall Risk   ?Falls in the past year? 0 0 0 0  ?Number falls in past yr: 0 0 0 0  ?Injury with Fall? 0 0 0 0  ?Risk for  fall due to : No Fall Risks  No Fall Risks   ?Follow up Falls evaluation completed;Falls prevention discussed Falls evaluation completed Falls evaluation completed Falls prevention discussed  ? ? ?FALL RISK PREVENTION PERTAINING TO THE HOME: ? ?Any stairs in or around the home? Yes  ?If so, are there any without handrails? No  ?Home free of loose throw rugs in walkways, pet beds, electrical cords, etc? No  ?Adequate lighting in your home to reduce risk of falls? Yes  ? ?  ASSISTIVE DEVICES UTILIZED TO PREVENT FALLS: ? ?Use of a cane, walker or w/c? No  ?Grab bars in the bathroom? Yes  ?Shower chair or bench in shower? No  ?Elevated toilet seat or a handicapped toilet? No  ? ?Cognitive Function: ?  ? ?  03/03/2021  ?  2:00 PM  ?Montreal Cognitive Assessment   ?Visuospatial/ Executive (0/5) 3  ?Naming (0/3) 3  ?Attention: Read list of digits (0/2) 2  ?Attention: Read list of letters (0/1) 1  ?Attention: Serial 7 subtraction starting at 100 (0/3) 3  ?Language: Repeat phrase (0/2) 2  ?Language : Fluency (0/1) 0  ?Abstraction (0/2) 2  ?Delayed Recall (0/5) 5  ?Orientation (0/6) 6  ?Total 27  ?Adjusted Score (based on education) 28  ? ? ?  12/14/2021  ?  2:29 PM  ?6CIT Screen  ?What Year? 0 points  ?What month? 0 points  ?What time? 0 points  ?Count back from 20 2 points  ?Months in reverse 4 points  ?Repeat phrase 4 points  ?Total Score 10 points  ? ? ?Immunizations ?Immunization History  ?Administered Date(s) Administered  ? Fluad Quad(high Dose 65+) 05/23/2019  ? Influenza,inj,quad, With Preservative 09/18/2019  ? Pneumococcal Conjugate-13 03/25/2015  ? Pneumococcal Polysaccharide-23 05/25/2016  ? Zoster, Live 03/31/2015  ? ? ?TDAP status: Due, Education has been provided regarding the importance of this vaccine. Advised may receive this vaccine at local pharmacy or Health Dept. Aware to provide a copy of the vaccination record if obtained from local pharmacy or Health Dept. Verbalized acceptance and understanding. ? ?Flu  Vaccine status: Declined, Education has been provided regarding the importance of this vaccine but patient still declined. Advised may receive this vaccine at local pharmacy or Health Dept. Aware to p

## 2021-12-16 ENCOUNTER — Other Ambulatory Visit: Payer: Self-pay

## 2021-12-16 MED ORDER — ROSUVASTATIN CALCIUM 5 MG PO TABS: 5.0000 mg | ORAL_TABLET | Freq: Every day | ORAL | 0 refills | Status: DC

## 2022-01-10 ENCOUNTER — Other Ambulatory Visit: Payer: Self-pay

## 2022-01-10 MED ORDER — ATENOLOL 50 MG PO TABS: 50.0000 mg | ORAL_TABLET | Freq: Two times a day (BID) | ORAL | 1 refills | Status: DC

## 2022-01-25 ENCOUNTER — Other Ambulatory Visit: Payer: Self-pay

## 2022-01-25 MED ORDER — LOSARTAN POTASSIUM 25 MG PO TABS: 25.0000 mg | ORAL_TABLET | Freq: Every day | ORAL | 1 refills | Status: DC

## 2022-02-01 ENCOUNTER — Emergency Department (HOSPITAL_COMMUNITY)
Admission: EM | Admit: 2022-02-01 | Discharge: 2022-02-02 | Disposition: A | Discharge status: Home / Self Care | Payer: Medicare Other | Attending: Emergency Medicine | Admitting: Emergency Medicine

## 2022-02-01 ENCOUNTER — Encounter (HOSPITAL_COMMUNITY): Payer: Self-pay | Admitting: Emergency Medicine

## 2022-02-01 DIAGNOSIS — N289 Disorder of kidney and ureter, unspecified: Secondary | ICD-10-CM | POA: Insufficient documentation

## 2022-02-01 DIAGNOSIS — T50901A Poisoning by unspecified drugs, medicaments and biological substances, accidental (unintentional), initial encounter: Secondary | ICD-10-CM | POA: Diagnosis not present

## 2022-02-01 DIAGNOSIS — I130 Hypertensive heart and chronic kidney disease with heart failure and stage 1 through stage 4 chronic kidney disease, or unspecified chronic kidney disease: Secondary | ICD-10-CM | POA: Diagnosis not present

## 2022-02-01 DIAGNOSIS — T447X1A Poisoning by beta-adrenoreceptor antagonists, accidental (unintentional), initial encounter: Secondary | ICD-10-CM | POA: Diagnosis not present

## 2022-02-01 DIAGNOSIS — N189 Chronic kidney disease, unspecified: Secondary | ICD-10-CM | POA: Diagnosis not present

## 2022-02-01 DIAGNOSIS — Z79899 Other long term (current) drug therapy: Secondary | ICD-10-CM | POA: Diagnosis not present

## 2022-02-01 DIAGNOSIS — I10 Essential (primary) hypertension: Secondary | ICD-10-CM

## 2022-02-01 DIAGNOSIS — I509 Heart failure, unspecified: Secondary | ICD-10-CM | POA: Insufficient documentation

## 2022-02-01 NOTE — ED Provider Triage Note (Signed)
Emergency Medicine Provider Triage Evaluation Note  Lisa Ortega , a 76 y.o. female  was evaluated in triage.  Pt complains of accidental ingestion.  States she has already taken her night time meds tonight, got busy cleaning up the kitchen and accidentally took another dose of night time meds.  Essentially has had 1 extra dose of atenolol, losartan, and rosuvastatin.  She is asymptomatic currently.  Review of Systems  Positive: Accidental ingestion Negative: Chest pain, dizziness  Physical Exam  BP (!) 191/75 (BP Location: Right Arm)   Pulse 81   Temp 98.2 F (36.8 C) (Oral)   Resp 18   Ht '5\' 2"'$  (1.575 m)   Wt 98.9 kg   SpO2 99%   BMI 39.87 kg/m  Gen:   Awake, no distress   Resp:  Normal effort  MSK:   Moves extremities without difficulty  Other:    Medical Decision Making  Medically screening exam initiated at 11:51 PM.  Appropriate orders placed.  Lisa Ortega was informed that the remainder of the evaluation will be completed by another provider, this initial triage assessment does not replace that evaluation, and the importance of remaining in the ED until their evaluation is complete.  Accidental ingestion, took extra dose of atenolol, losartan, and rosuvastatin.  Denies any attempt at self-harm.  Currently asymptomatic.  She is actually still hypertensive in triage.  Will check EKG and basic labs.   Larene Pickett, PA-C 02/01/22 2353

## 2022-02-01 NOTE — ED Triage Notes (Signed)
Patient accidentally took 2 doses of atenolol 50 mg, losartan 25 mg, and rosuvastatin calcium 5 mg.  Patient denies any symptoms currently.

## 2022-02-02 LAB — CBC WITH DIFFERENTIAL/PLATELET
Abs Immature Granulocytes: 0.02 10*3/uL (ref 0.00–0.07)
Basophils Absolute: 0 10*3/uL (ref 0.0–0.1)
Basophils Relative: 0 %
Eosinophils Absolute: 0.2 10*3/uL (ref 0.0–0.5)
Eosinophils Relative: 2 %
HCT: 38.4 % (ref 36.0–46.0)
Hemoglobin: 12.5 g/dL (ref 12.0–15.0)
Immature Granulocytes: 0 %
Lymphocytes Relative: 28 %
Lymphs Abs: 2.2 10*3/uL (ref 0.7–4.0)
MCH: 29.3 pg (ref 26.0–34.0)
MCHC: 32.6 g/dL (ref 30.0–36.0)
MCV: 90.1 fL (ref 80.0–100.0)
Monocytes Absolute: 0.6 10*3/uL (ref 0.1–1.0)
Monocytes Relative: 8 %
Neutro Abs: 4.9 10*3/uL (ref 1.7–7.7)
Neutrophils Relative %: 62 %
Platelets: 198 10*3/uL (ref 150–400)
RBC: 4.26 MIL/uL (ref 3.87–5.11)
RDW: 12.9 % (ref 11.5–15.5)
WBC: 7.9 10*3/uL (ref 4.0–10.5)
nRBC: 0 % (ref 0.0–0.2)

## 2022-02-02 LAB — BASIC METABOLIC PANEL
Anion gap: 7 (ref 5–15)
BUN: 23 mg/dL (ref 8–23)
CO2: 28 mmol/L (ref 22–32)
Calcium: 9.4 mg/dL (ref 8.9–10.3)
Chloride: 105 mmol/L (ref 98–111)
Creatinine, Ser: 1.26 mg/dL — ABNORMAL HIGH (ref 0.44–1.00)
GFR, Estimated: 44 mL/min — ABNORMAL LOW (ref >60)
Glucose, Bld: 153 mg/dL — ABNORMAL HIGH (ref 70–99)
Potassium: 4.3 mmol/L (ref 3.5–5.1)
Sodium: 140 mmol/L (ref 135–145)

## 2022-02-02 NOTE — ED Provider Notes (Signed)
[facility] EMERGENCY DEPARTMENT Provider Note   CSN: [id number] Arrival date & time: 02/01/22  2333     History  Chief Complaint  Patient presents with   Ingestion    Lisa Ortega is a 76 y.o. female.  The history is provided by the patient.  Ingestion She has history of hypertension, hyperlipidemia, diastolic heart failure, chronic kidney disease and comes in after an accidental overdose.  She took her usual nighttime medications of rosuvastatin 5 mg, losartan 25 mg, and atenolol 50 mg at about 6:30 PM.  At about 8:30 PM, she took the same medications thinking that she had missed her nighttime dose tonight.  This was in spite of the fact that she was using a daily pill organizer.  She denies any dizziness or lightheadedness.  Atenolol dose is 50 mg twice daily, losartan and rosuvastatin are once a day.   Home Medications Prior to Admission medications   Medication Sig Start Date End Date Taking? Authorizing Provider  albuterol (VENTOLIN HFA) 108 (90 Base) MCG/ACT inhaler Inhale 1 puff into the lungs every 6 (six) hours as needed for wheezing or shortness of breath. 01/31/21   [provider]  atenolol (TENORMIN) 50 MG tablet Take 1 tablet (50 mg total) by mouth 2 (two) times daily. 01/10/22   CoxElnita Maxwell, MD  Cholecalciferol (VITAMIN D3) 50 MCG (2000 UT) capsule Take 2,000 Units by mouth daily.    [provider]  furosemide (LASIX) 40 MG tablet Take 0.5 tablets (20 mg total) by mouth daily. 11/23/21   Cox, Kirsten, MD  KLOR-CON M20 20 MEQ tablet Take 20 mEq by mouth daily. 11/02/19   [provider]  losartan (COZAAR) 25 MG tablet Take 1 tablet (25 mg total) by mouth daily. 01/25/22   Cox, Elnita Maxwell, MD  rosuvastatin (CRESTOR) 5 MG tablet Take 1 tablet (5 mg total) by mouth daily. 12/16/21   Rochel Brome, MD      Allergies    Amlodipine and Lisinopril-hydrochlorothiazide    Review of Systems   Review of Systems  All other systems  reviewed and are negative.  Physical Exam Updated Vital Signs BP (!) 165/62   Pulse 71   Temp 98.2 F (36.8 C) (Oral)   Resp 18   Ht '5\' 2"'$  (1.575 m)   Wt 98.9 kg   SpO2 100%   BMI 39.87 kg/m  Physical Exam Vitals and nursing note reviewed.   76  year old female, resting comfortably and in no acute distress. Vital signs are significant for elevated blood pressure. Oxygen saturation is 100%, which is normal. Head is normocephalic and atraumatic. PERRLA, EOMI. Oropharynx is clear. Neck is nontender and supple without adenopathy or JVD. Back is nontender and there is no CVA tenderness. Lungs are clear without rales, wheezes, or rhonchi. Chest is nontender. Heart has regular rate and rhythm without murmur. Abdomen is soft, flat, nontender. Extremities have no cyanosis or edema, full range of motion is present. Skin is warm and dry without rash. Neurologic: Mental status is normal, cranial nerves are intact, there are no motor or sensory deficits.  ED Results / Procedures / Treatments   Labs (all labs ordered are listed, but only abnormal results are displayed) Labs Reviewed  BASIC METABOLIC PANEL - Abnormal; Notable for the following components:      Result Value   Glucose, Bld 153 (*)    Creatinine, Ser 1.26 (*)    GFR, Estimated 44 (*)    All other components  within normal limits  CBC WITH DIFFERENTIAL/PLATELET    EKG EKG Interpretation  Date/Time:  Tuesday Feb 01 2022 23:54:50 EDT Ventricular Rate:  73 PR Interval:  200 QRS Duration: 84 QT Interval:  400 QTC Calculation: 440 R Axis:   -9 Text Interpretation: Normal sinus rhythm Low voltage QRS Possible Inferior infarct , age undetermined Cannot rule out Anterior infarct , age undetermined Abnormal ECG No previous ECGs available Confirmed by Delora Fuel ([id number]) on 02/02/2022 3:26:36 AM  Procedures Procedures  Cardiac monitor shows normal sinus rhythm, per my interpretation.  Medications Ordered in  ED Medications - No data to display  ED Course/ Medical Decision Making/ A&P                           Medical Decision Making  Accidental overdose of beta-blocker, ARB, statin.  Her doses of rosuvastatin and losartan are relatively low, double dose is well within the range of therapeutic doses.  She is on a fairly high dose of atenolol, but is not showing any signs of toxicity.  It is now about 6 hours following ingestion and she is not showing any evidence of hypotension or bradycardia.  I have reviewed all of the laboratory tests and my interpretation is stable renal insufficiency, normal CBC.  Case has been discussed with poison control and given her lack of toxicity 6 hours after ingestion, they feel she is stable for discharge.  She is advised to  Final Clinical Impression(s) / ED Diagnoses Final diagnoses:  Accidental drug overdose, initial encounter  Elevated blood pressure reading with diagnosis of hypertension  Renal insufficiency    Rx / DC Orders ED Discharge Orders     None         Delora Fuel, MD 85/63/14 0330

## 2022-02-02 NOTE — Discharge Instructions (Signed)
Do not take your dose of atenolol (Tenormin) in the morning of 02/02/2022.  Take all of your normal dose of atenolol (Tenormin) rosuvastatin) Crestor, and losartan (Cozaar) in the evening of 02/02/2022.  Once you have set up your pill organizer, do not take medication that is set aside for a different day.

## 2022-02-08 ENCOUNTER — Other Ambulatory Visit: Payer: Self-pay

## 2022-02-08 DIAGNOSIS — I5032 Chronic diastolic (congestive) heart failure: Secondary | ICD-10-CM

## 2022-02-08 MED ORDER — FUROSEMIDE 40 MG PO TABS: 20.0000 mg | ORAL_TABLET | Freq: Every day | ORAL | 0 refills | Status: DC

## 2022-02-14 ENCOUNTER — Other Ambulatory Visit: Payer: Self-pay

## 2022-02-14 DIAGNOSIS — I5032 Chronic diastolic (congestive) heart failure: Secondary | ICD-10-CM

## 2022-02-14 MED ORDER — FUROSEMIDE 40 MG PO TABS: 20.0000 mg | ORAL_TABLET | Freq: Every day | ORAL | 0 refills | Status: DC

## 2022-02-14 MED ORDER — FUROSEMIDE 20 MG PO TABS: 20.0000 mg | ORAL_TABLET | Freq: Every day | ORAL | 1 refills | Status: DC

## 2022-02-20 NOTE — Progress Notes (Signed)
Cancelled.  

## 2022-03-06 ENCOUNTER — Other Ambulatory Visit: Payer: Self-pay | Admitting: Family Medicine

## 2022-03-30 ENCOUNTER — Telehealth: Payer: Self-pay

## 2022-03-30 DIAGNOSIS — R112 Nausea with vomiting, unspecified: Secondary | ICD-10-CM | POA: Diagnosis not present

## 2022-03-30 DIAGNOSIS — Z792 Long term (current) use of antibiotics: Secondary | ICD-10-CM | POA: Diagnosis not present

## 2022-03-30 DIAGNOSIS — M1990 Unspecified osteoarthritis, unspecified site: Secondary | ICD-10-CM | POA: Diagnosis not present

## 2022-03-30 DIAGNOSIS — R531 Weakness: Secondary | ICD-10-CM | POA: Diagnosis not present

## 2022-03-30 DIAGNOSIS — K8032 Calculus of bile duct with acute cholangitis without obstruction: Secondary | ICD-10-CM | POA: Diagnosis not present

## 2022-03-30 DIAGNOSIS — N179 Acute kidney failure, unspecified: Secondary | ICD-10-CM | POA: Diagnosis not present

## 2022-03-30 DIAGNOSIS — I2510 Atherosclerotic heart disease of native coronary artery without angina pectoris: Secondary | ICD-10-CM | POA: Diagnosis not present

## 2022-03-30 DIAGNOSIS — K8064 Calculus of gallbladder and bile duct with chronic cholecystitis without obstruction: Secondary | ICD-10-CM | POA: Diagnosis not present

## 2022-03-30 DIAGNOSIS — Z9049 Acquired absence of other specified parts of digestive tract: Secondary | ICD-10-CM | POA: Diagnosis not present

## 2022-03-30 DIAGNOSIS — R1011 Right upper quadrant pain: Secondary | ICD-10-CM | POA: Diagnosis not present

## 2022-03-30 DIAGNOSIS — E876 Hypokalemia: Secondary | ICD-10-CM | POA: Diagnosis not present

## 2022-03-30 DIAGNOSIS — I129 Hypertensive chronic kidney disease with stage 1 through stage 4 chronic kidney disease, or unspecified chronic kidney disease: Secondary | ICD-10-CM | POA: Diagnosis not present

## 2022-03-30 DIAGNOSIS — A419 Sepsis, unspecified organism: Secondary | ICD-10-CM | POA: Diagnosis not present

## 2022-03-30 DIAGNOSIS — R0600 Dyspnea, unspecified: Secondary | ICD-10-CM | POA: Diagnosis not present

## 2022-03-30 DIAGNOSIS — K838 Other specified diseases of biliary tract: Secondary | ICD-10-CM | POA: Diagnosis not present

## 2022-03-30 DIAGNOSIS — K8012 Calculus of gallbladder with acute and chronic cholecystitis without obstruction: Secondary | ICD-10-CM | POA: Diagnosis not present

## 2022-03-30 DIAGNOSIS — R079 Chest pain, unspecified: Secondary | ICD-10-CM | POA: Diagnosis not present

## 2022-03-30 DIAGNOSIS — K8020 Calculus of gallbladder without cholecystitis without obstruction: Secondary | ICD-10-CM | POA: Diagnosis not present

## 2022-03-30 DIAGNOSIS — E860 Dehydration: Secondary | ICD-10-CM | POA: Diagnosis not present

## 2022-03-30 DIAGNOSIS — N289 Disorder of kidney and ureter, unspecified: Secondary | ICD-10-CM | POA: Diagnosis not present

## 2022-03-30 DIAGNOSIS — D72829 Elevated white blood cell count, unspecified: Secondary | ICD-10-CM | POA: Diagnosis not present

## 2022-03-30 DIAGNOSIS — E872 Acidosis, unspecified: Secondary | ICD-10-CM | POA: Diagnosis not present

## 2022-03-30 DIAGNOSIS — E7800 Pure hypercholesterolemia, unspecified: Secondary | ICD-10-CM | POA: Diagnosis not present

## 2022-03-30 DIAGNOSIS — I700 Atherosclerosis of aorta: Secondary | ICD-10-CM | POA: Diagnosis not present

## 2022-03-30 DIAGNOSIS — R509 Fever, unspecified: Secondary | ICD-10-CM | POA: Diagnosis not present

## 2022-03-30 DIAGNOSIS — N189 Chronic kidney disease, unspecified: Secondary | ICD-10-CM | POA: Diagnosis not present

## 2022-03-30 DIAGNOSIS — I10 Essential (primary) hypertension: Secondary | ICD-10-CM | POA: Diagnosis not present

## 2022-03-30 DIAGNOSIS — R197 Diarrhea, unspecified: Secondary | ICD-10-CM | POA: Diagnosis not present

## 2022-03-30 DIAGNOSIS — K8010 Calculus of gallbladder with chronic cholecystitis without obstruction: Secondary | ICD-10-CM | POA: Diagnosis not present

## 2022-03-30 DIAGNOSIS — R7401 Elevation of levels of liver transaminase levels: Secondary | ICD-10-CM | POA: Diagnosis not present

## 2022-03-30 DIAGNOSIS — Z6841 Body Mass Index (BMI) 40.0 and over, adult: Secondary | ICD-10-CM | POA: Diagnosis not present

## 2022-03-30 NOTE — Telephone Encounter (Signed)
Daughter called stating patient was complaining of back pain (lower), vomiting and diarrhea (since this a.m.) unable to keep anything down. Has had some chills, some chest pain, upper abdominal pain, fatigue but no shortness of breath. Advised that patient be seen at the ED in case she needs iv fluids or any testing. Daughter verbalized understanding.

## 2022-03-30 NOTE — Telephone Encounter (Signed)
Agree. Dr. Hillery Bhalla  

## 2022-04-01 DIAGNOSIS — K8012 Calculus of gallbladder with acute and chronic cholecystitis without obstruction: Secondary | ICD-10-CM | POA: Diagnosis not present

## 2022-04-01 HISTORY — PX: CHOLECYSTECTOMY: SHX55

## 2022-04-02 DIAGNOSIS — R933 Abnormal findings on diagnostic imaging of other parts of digestive tract: Secondary | ICD-10-CM | POA: Diagnosis not present

## 2022-04-02 DIAGNOSIS — I130 Hypertensive heart and chronic kidney disease with heart failure and stage 1 through stage 4 chronic kidney disease, or unspecified chronic kidney disease: Secondary | ICD-10-CM | POA: Diagnosis not present

## 2022-04-02 DIAGNOSIS — E872 Acidosis, unspecified: Secondary | ICD-10-CM | POA: Diagnosis not present

## 2022-04-02 DIAGNOSIS — E785 Hyperlipidemia, unspecified: Secondary | ICD-10-CM | POA: Diagnosis not present

## 2022-04-02 DIAGNOSIS — Z9049 Acquired absence of other specified parts of digestive tract: Secondary | ICD-10-CM | POA: Diagnosis not present

## 2022-04-02 DIAGNOSIS — K8032 Calculus of bile duct with acute cholangitis without obstruction: Secondary | ICD-10-CM | POA: Diagnosis not present

## 2022-04-02 DIAGNOSIS — K8050 Calculus of bile duct without cholangitis or cholecystitis without obstruction: Secondary | ICD-10-CM | POA: Diagnosis not present

## 2022-04-02 DIAGNOSIS — Z6841 Body Mass Index (BMI) 40.0 and over, adult: Secondary | ICD-10-CM | POA: Diagnosis not present

## 2022-04-02 DIAGNOSIS — I509 Heart failure, unspecified: Secondary | ICD-10-CM | POA: Diagnosis not present

## 2022-04-02 DIAGNOSIS — I5032 Chronic diastolic (congestive) heart failure: Secondary | ICD-10-CM | POA: Diagnosis not present

## 2022-04-02 DIAGNOSIS — R531 Weakness: Secondary | ICD-10-CM | POA: Diagnosis not present

## 2022-04-02 DIAGNOSIS — A419 Sepsis, unspecified organism: Secondary | ICD-10-CM | POA: Diagnosis not present

## 2022-04-02 DIAGNOSIS — K9186 Retained cholelithiasis following cholecystectomy: Secondary | ICD-10-CM | POA: Diagnosis not present

## 2022-04-02 DIAGNOSIS — N1832 Chronic kidney disease, stage 3b: Secondary | ICD-10-CM | POA: Diagnosis not present

## 2022-04-02 DIAGNOSIS — Z743 Need for continuous supervision: Secondary | ICD-10-CM | POA: Diagnosis not present

## 2022-04-02 DIAGNOSIS — R5381 Other malaise: Secondary | ICD-10-CM | POA: Diagnosis not present

## 2022-04-04 NOTE — Discharge Summary (Signed)
 ------------------------------------------------------------------------------- Attestation signed by Lamar JINNY Satchel, MD at 04/04/22 1017 I have reviewed the note above with the advanced practitioner and agree with the plan as outlined.\  Lamar DOROTHA Satchel, M.D.   -------------------------------------------------------------------------------  [facility]  Discharge Summary  PCP: No primary care provider on file. Discharge Details   Admit date:         04/02/2022 Discharge date:        04/04/2022  Hospital Days:    2 days  Active Hospital Problems   Diagnosis Date Noted POA  . *Choledocholithiasis 04/02/2022 Yes  . Primary hypertension 04/02/2022 Unknown  . Hypertensive kidney disease with stage 3b chronic kidney disease (*) 03/03/2021 Yes  . Diastolic dysfunction with chronic heart failure (*) 03/03/2021 Yes  . Morbid (severe) obesity due to excess calories (*) 03/03/2021 Yes    Resolved Hospital Problems  No resolved problems to display.    Current Discharge Medication List    NEW medications   Details  ciprofloxacin  (CIPRO ) 500 mg tablet Take one tablet (500 mg dose) by mouth 2 (two) times daily. Start date: 04/04/2022      CONTINUED medications   Details  atenolol  (TENORMIN ) 50 mg tablet Take one tablet (50 mg dose) by mouth 2 (two) times daily.    Cholecalciferol 50 MCG (2000 UT) CAPS Take one capsule by mouth daily.    furosemide  (LASIX ) 20 mg tablet Take one tablet (20 mg dose) by mouth daily.    losartan  potassium (COZAAR ) 25 mg tablet Take one tablet (25 mg dose) by mouth daily.    potassium chloride (KLOR-CON M20) 20 mEq CR tablet Take one tablet (20 mEq dose) by mouth daily.    rosuvastatin  calcium  (CRESTOR ) 5 mg tablet Take one tablet (5 mg dose) by mouth daily.       Hospital Course  Physicians involved in care during this hospitalization Attending Provider: Norleen ONEIDA Lie, MD Admitting Provider: Norleen ONEIDA Lie,  MD Consulting Physician: [facility] Consulting Physician: Elveria JINNY Argyle, MD Anesthesiologist: Manus Manns, MD  Indication for Admission:       Choledocholithiasis Hospital Course:       No notes on file 76 year old female with history of CHF, HTN, CKD(stage III), Obesity presented to OSH ([facility], [place]) withsudden onset of upper abdominal pain, nausea and fever (102) which prompted her to seek emergent medical evaluation at local facility. There she was found to have leukocytosis, fever, elevated LFTs and cholelithiasis on abdomina ultrasound. CT scan revealedlsignificant cholelithiasis with evidence of pneumobilia consistent with possible passed stone. She was admitted for cholangitis/sepsis and treated with IV Invanz. Patient underwent lap chole (7/21) with IOC positive for choledocholithiasis. Patient's bilirubin was noted to increase from 2.7-3.  She was transferred here for further evaluation with ERCP.  She was continued on IV Cipro .  She underwent ERCP yesterday with findings of choledocholithiasis with biliary sphincterotomy and balloon extraction performed.  LFTs trending down.  Patient without complaints on date of discharge.  WBC within normal limits.  Afebrile.  Daughter at the bedside.  We will plan to discharge home today on 5 additional days of oral Cipro .  Bedside Procedures   No orders found        Post Hospital Care       Recommendations to physicians: none  Potential for Rehab:        Good  Code Status:   Full Code   Time spent in discharge  process: 25 minutes  Electronically signed: Deward CHRISTELLA Hacker, ANP 04/04/2022 / 9:45 AM

## 2022-04-06 ENCOUNTER — Other Ambulatory Visit: Payer: Self-pay | Admitting: *Deleted

## 2022-04-06 NOTE — Patient Outreach (Signed)
  Care Coordination [facility] Note Transition Care Management Unsuccessful Follow-up Telephone Call  Date of discharge and from where:  04/04/22 [facility]  Attempts:  1st Attempt  Reason for unsuccessful TCM follow-up call:  Left voice message  Emelia Loron RN, BSN [place] [phone] [email or web address]

## 2022-04-07 ENCOUNTER — Other Ambulatory Visit: Payer: Self-pay | Admitting: *Deleted

## 2022-04-07 NOTE — Patient Outreach (Signed)
  Care Coordination [facility] Note Transition Care Management Follow-up Telephone Call Date of discharge and from where: 04/04/22 [facility] How have you been since you were released from the hospital? Daughter states she is doing well. Any questions or concerns? Yes, daughter states no follow-up appointment  Items Reviewed: Did the pt receive and understand the discharge instructions provided? Yes  Medications obtained and verified? Yes  Other? No  Any new allergies since your discharge? No  Dietary orders reviewed? Yes Do you have support at home? Yes   Home Care and Equipment/Supplies: Were home health services ordered? no If so, what is the name of the agency? N/A  Has the agency set up a time to come to the patient's home? not applicable Were any new equipment or medical supplies ordered?  No What is the name of the medical supply agency? N/A Were you able to get the supplies/equipment? not applicable Do you have any questions related to the use of the equipment or supplies? No  Functional Questionnaire: (I = Independent and D = Dependent) ADLs: I  Bathing/Dressing- D  Meal Prep- D  Eating- I  Maintaining continence- I  Transferring/Ambulation- D  Managing Meds- D  Follow up appointments reviewed:  PCP Hospital f/u appt confirmed? No  , nurse will send message to [facility]. [place] [facility] f/u appt confirmed? No   Are transportation arrangements needed? No  If their condition worsens, is the pt aware to call PCP or go to the Emergency Dept.? Yes Was the patient provided with contact information for the PCP's office or ED? Yes Was to pt encouraged to call back with questions or concerns? Yes  SDOH assessments and interventions completed:   Yes  Care Coordination Interventions Activated:  Yes Care Coordination Interventions:   N/A  Encounter Outcome:  Pt. Visit Completed  Emelia Loron RN, BSN [place] [phone] [email or web address]

## 2022-04-12 ENCOUNTER — Telehealth: Payer: Self-pay | Admitting: Family Medicine

## 2022-04-12 NOTE — Telephone Encounter (Signed)
hospital follow - [facility] 07.24.23 post surgery - daughter stated her mother seems to be doing fine besides she has been sleeping a lot more than normal. Daughter said she is fine seeing different provider if need be she mostly wanting to get her blood work checked.

## 2022-04-12 NOTE — Telephone Encounter (Signed)
Scheduled. kc

## 2022-04-14 ENCOUNTER — Ambulatory Visit (INDEPENDENT_AMBULATORY_CARE_PROVIDER_SITE_OTHER): Payer: Medicare Other | Admitting: Family Medicine

## 2022-04-14 ENCOUNTER — Encounter: Payer: Self-pay | Admitting: Family Medicine

## 2022-04-14 VITALS — BP 112/72 | HR 79 | Temp 98.5°F | Resp 18 | Ht 62.0 in | Wt 215.2 lb

## 2022-04-14 DIAGNOSIS — R400 Somnolence: Secondary | ICD-10-CM

## 2022-04-14 DIAGNOSIS — Z6839 Body mass index (BMI) 39.0-39.9, adult: Secondary | ICD-10-CM

## 2022-04-14 DIAGNOSIS — K8050 Calculus of bile duct without cholangitis or cholecystitis without obstruction: Secondary | ICD-10-CM | POA: Insufficient documentation

## 2022-04-14 DIAGNOSIS — N1832 Chronic kidney disease, stage 3b: Secondary | ICD-10-CM | POA: Diagnosis not present

## 2022-04-14 DIAGNOSIS — I129 Hypertensive chronic kidney disease with stage 1 through stage 4 chronic kidney disease, or unspecified chronic kidney disease: Secondary | ICD-10-CM

## 2022-04-14 DIAGNOSIS — R7301 Impaired fasting glucose: Secondary | ICD-10-CM

## 2022-04-14 DIAGNOSIS — R5383 Other fatigue: Secondary | ICD-10-CM | POA: Diagnosis not present

## 2022-04-14 DIAGNOSIS — E538 Deficiency of other specified B group vitamins: Secondary | ICD-10-CM | POA: Diagnosis not present

## 2022-04-14 DIAGNOSIS — E782 Mixed hyperlipidemia: Secondary | ICD-10-CM

## 2022-04-14 NOTE — Progress Notes (Signed)
Subjective:  Patient ID: Lisa Ortega, female    DOB: May 22, 1946  Age: 76 y.o. MRN: [id number]  Chief Complaint  Patient presents with   Transitions Of Care   Choledocholithiasis    HPI Patient was admitted to [facility] and had GB removed by Dr. Noberto Retort.  Patient presented with sudden onset of upper abdominal pain, nausea and fever (102). She went to [facility]. They found leukocytosis, fever, elevated LFTs and cholelithiasis on abdominal ultrasound. Ct scan revealed significant cholelithiasis with evidence of pneumobilia consistent with possible passed stone. She was admitted for cholangitis/sepsis and treated with IV Invanz. Patient underwent lap chole (7/21) with IOC positive for choledocholithiasis.   Patient's bilirubin was noted to increase from 2.7-3. Transferred to [name] and admitted to Dr. Dory Larsen.. Admit date: 04/02/2022 and Discharge date: 04/04/2022 . She was admitted for 2 days.   She was diagnosed with Choledocholithiasis, primary hypertension, Hypertensive kidney disease stage 3b. ERCP performed and 7 gallstones removed from duct. LFTs were very high.   They sent her home with a prescription of ciprofloxacin (CIPRO) 500 mg tablet Take one tablet (500 mg dose) by mouth 2 (two) times daily. Completed.  Patient presented with sudden onset of upper abdominal pain, nausea and fever (102). She went to [facility]. They found leukocytosis, fever, elevated LFTs and cholelithiasis on abdominal ultrasound.  Ct scan revealed significant cholelithiasis with evidence of pneumobilia consistent with possible passed stone. She admitted for cholangitis/sepsis and treated with IV Invanz. Patient underwent lap chole (7/21) with IOC positive for choledocholithiasis.   Patient's bilirubin was noted to increase from 2.7-3.   Current Outpatient Medications on File Prior to Visit  Medication Sig Dispense Refill   albuterol (VENTOLIN HFA) 108 (90 Base) MCG/ACT inhaler Inhale 1 puff into the  lungs every 6 (six) hours as needed for wheezing or shortness of breath.     atenolol (TENORMIN) 50 MG tablet Take 1 tablet (50 mg total) by mouth 2 (two) times daily. 180 tablet 1   Cholecalciferol (VITAMIN D3) 50 MCG (2000 UT) capsule Take 2,000 Units by mouth daily.     furosemide (LASIX) 20 MG tablet Take 1 tablet (20 mg total) by mouth daily. 90 tablet 1   KLOR-CON M20 20 MEQ tablet Take 20 mEq by mouth daily.     losartan (COZAAR) 25 MG tablet Take 1 tablet (25 mg total) by mouth daily. 90 tablet 1   rosuvastatin (CRESTOR) 5 MG tablet Take 1 tablet by mouth once daily 90 tablet 0   No current facility-administered medications on file prior to visit.   Past Medical History:  Diagnosis Date   Acute on chronic diastolic (congestive) heart failure (HCC)    Essential (primary) hypertension    Essential hypertension, benign 11/21/2019   Hypertensive chronic kidney disease with stage 1 through stage 4 chronic kidney disease, or unspecified chronic kidney disease    Impaired fasting glucose    Localized edema    Memory loss 03/03/2021   Mixed hyperlipidemia    Morbid (severe) obesity due to excess calories (HCC)    Obesity, Class III, BMI 40-49.9 (morbid obesity) ([place]) 11/23/2019   Other seasonal allergic rhinitis    Other specified nonscarring hair loss    Pain in left knee 01/21/2020   Telogen effluvium    Past Surgical History:  Procedure Laterality Date   CHOLECYSTECTOMY  04/01/2022   Dr Dian Queen   TONSILLECTOMY      Family History  Problem Relation Age of Onset  Transient ischemic attack Mother    Alzheimer's disease Mother    Stroke Father    Throat cancer Father    Breast cancer Sister    Pulmonary embolism Brother    Asthma Other    Diabetes type II Other    Hypertension Other    Hypothyroidism Other    Heart attack Other    Migraines Other    Osteoarthritis Other    Breast cancer Niece    Social History   Socioeconomic History   Marital status: Divorced     Spouse name: Not on file   Number of children: 3   Years of education: Not on file   Highest education level: Not on file  Occupational History   Occupation: Environmental health practitioner  Tobacco Use   Smoking status: Never   Smokeless tobacco: Never  Vaping Use   Vaping Use: Never used  Substance and Sexual Activity   Alcohol use: Never   Drug use: Never   Sexual activity: Not Currently    Birth control/protection: Post-menopausal  Other Topics Concern   Not on file  Social History Narrative   3 daughters, 5 grandchildren, 5 great-grandchildren   Right handed   One story home, couple of steps at door   Drinks caffeine   Social Determinants of Health   Financial Resource Strain: Not on file  Food Insecurity: No Food Insecurity (12/14/2021)   Hunger Vital Sign    Worried About Running Out of Food in the Last Year: Never true    Ran Out of Food in the Last Year: Never true  Transportation Needs: Not on file  Physical Activity: Inactive (12/14/2021)   Exercise Vital Sign    Days of Exercise per Week: 0 days    Minutes of Exercise per Session: 0 min  Stress: Not on file  Social Connections: Not on file    Review of Systems  Constitutional:  Positive for fatigue. Negative for chills and fever.  HENT:  Negative for congestion, ear pain and sore throat.   Respiratory:  Negative for cough and shortness of breath.   Cardiovascular:  Negative for chest pain and palpitations.  Gastrointestinal:  Negative for abdominal pain, constipation, diarrhea, nausea and vomiting.  Endocrine: Negative for polydipsia, polyphagia and polyuria.  Genitourinary:  Negative for difficulty urinating and dysuria.  Musculoskeletal:  Positive for back pain. Negative for arthralgias and myalgias.  Skin:  Negative for rash.  Neurological:  Negative for headaches.  Psychiatric/Behavioral:  Negative for dysphoric mood. The patient is not nervous/anxious.      Objective:  BP 112/72   Pulse 79   Temp 98.5 F (36.9 C)    Resp 18   Ht '5\' 2"'$  (1.575 m)   Wt 215 lb 3.2 oz (97.6 kg)   SpO2 97%   BMI 39.36 kg/m      04/14/2022    9:04 AM 02/02/2022    3:30 AM 02/02/2022    3:00 AM  BP/Weight  Systolic BP 270 623 762  Diastolic BP 72 56 70  Wt. (Lbs) 215.2    BMI 39.36 kg/m2      Physical Exam Vitals reviewed.  Constitutional:      Appearance: Normal appearance. She is normal weight.  Cardiovascular:     Rate and Rhythm: Normal rate and regular rhythm.     Heart sounds: Normal heart sounds.  Pulmonary:     Effort: Pulmonary effort is normal. No respiratory distress.     Breath sounds: Normal breath sounds.  Abdominal:     General: Abdomen is flat. Bowel sounds are normal.     Palpations: Abdomen is soft.     Tenderness: There is no abdominal tenderness.  Neurological:     Mental Status: She is alert and oriented to person, place, and time.  Psychiatric:        Mood and Affect: Mood normal.        Behavior: Behavior normal.    Lab Results  Component Value Date   WBC 7.9 02/02/2022   HGB 12.5 02/02/2022   HCT 38.4 02/02/2022   PLT 198 02/02/2022   GLUCOSE 153 (H) 02/02/2022   CHOL 137 11/23/2021   TRIG 62 11/23/2021   HDL 54 11/23/2021   LDLCALC 70 11/23/2021   ALT 12 11/23/2021   AST 22 11/23/2021   NA 140 02/02/2022   K 4.3 02/02/2022   CL 105 02/02/2022   CREATININE 1.26 (H) 02/02/2022   BUN 23 02/02/2022   CO2 28 02/02/2022   TSH 3.09 03/03/2021   HGBA1C 6.2 (H) 11/23/2021      Assessment & Plan:   Problem List Items Addressed This Visit       Digestive   Choledocholithiasis - Primary    Removed via ERCP at [facility].        Endocrine   Impaired fasting glucose    Recommend continue to work on eating healthy diet and exercise.       Relevant Orders   Hemoglobin A1c     Genitourinary   Hypertensive kidney disease with stage 3b chronic kidney disease ([place])    Well controlled.  No changes to medicines. Continue atenolol 50 mg twice daily, losartan 25 mg  daily, and lasix 20 mg daily.  Continue to work on eating a healthy diet and exercise.  Labs drawn today.        Relevant Orders   CBC with Differential/Platelet   Comprehensive metabolic panel     Other   Mixed hyperlipidemia    Well controlled.  No changes to medicines.  Continue to work on eating a healthy diet and exercise.        Relevant Orders   Lipid panel   Class 2 severe obesity due to excess calories with serious comorbidity and body mass index (BMI) of 39.0 to 39.9 in adult [facility])    Comorbidities: hypertension and hyperlipidemia.  Recommend continue to work on eating healthy diet and exercise.       Other fatigue    Mild.Greatly improved since hospitalization. Check labs.       Relevant Orders   TSH   Daytime somnolence    Recommend home sleep study.       Relevant Orders   Home sleep test   B12 deficiency    Check b12, mma, folate.       Relevant Orders   B12 and Folate Panel   Methylmalonic acid, serum   Orders Placed This Encounter  Procedures   CBC with Differential/Platelet   Comprehensive metabolic panel   O17 and Folate Panel   Methylmalonic acid, serum   TSH   Hemoglobin A1c   Lipid panel   Home sleep test     Follow-up: Return in about 6 weeks (around 05/26/2022) for chronic fasting.  An After Visit Summary was printed and given to the patient.  Lisa Brome, MD [facility] [phone]

## 2022-04-17 ENCOUNTER — Encounter: Payer: Self-pay | Admitting: Family Medicine

## 2022-04-17 DIAGNOSIS — R400 Somnolence: Secondary | ICD-10-CM | POA: Insufficient documentation

## 2022-04-17 DIAGNOSIS — E538 Deficiency of other specified B group vitamins: Secondary | ICD-10-CM | POA: Insufficient documentation

## 2022-04-17 DIAGNOSIS — A419 Sepsis, unspecified organism: Secondary | ICD-10-CM | POA: Insufficient documentation

## 2022-04-17 NOTE — Assessment & Plan Note (Signed)
Well controlled.  No changes to medicines. Continue atenolol 50 mg twice daily, losartan 25 mg daily, and lasix 20 mg daily.  Continue to work on eating a healthy diet and exercise.  Labs drawn today.

## 2022-04-17 NOTE — Assessment & Plan Note (Signed)
Recommend home sleep study.

## 2022-04-17 NOTE — Assessment & Plan Note (Signed)
Mild.Greatly improved since hospitalization. Check labs.

## 2022-04-17 NOTE — Assessment & Plan Note (Signed)
Removed via ERCP at [facility].

## 2022-04-17 NOTE — Assessment & Plan Note (Signed)
Comorbidities: hypertension and hyperlipidemia Recommend continue to work on eating healthy diet and exercise.  

## 2022-04-17 NOTE — Assessment & Plan Note (Signed)
Check b12, mma, folate.   

## 2022-04-17 NOTE — Assessment & Plan Note (Signed)
Recommend continue to work on eating healthy diet and exercise.  

## 2022-04-17 NOTE — Assessment & Plan Note (Signed)
Well controlled. No changes to medicines.  Continue to work on eating a healthy diet and exercise.    

## 2022-04-18 DIAGNOSIS — N1831 Chronic kidney disease, stage 3a: Secondary | ICD-10-CM | POA: Diagnosis not present

## 2022-04-18 LAB — COMPREHENSIVE METABOLIC PANEL
ALT: 16 IU/L (ref 0–32)
AST: 29 IU/L (ref 0–40)
Albumin/Globulin Ratio: 0.9 — ABNORMAL LOW (ref 1.2–2.2)
Albumin: 3.5 g/dL — ABNORMAL LOW (ref 3.8–4.8)
Alkaline Phosphatase: 124 IU/L — ABNORMAL HIGH (ref 44–121)
BUN/Creatinine Ratio: 18 (ref 12–28)
BUN: 23 mg/dL (ref 8–27)
Bilirubin Total: 0.7 mg/dL (ref 0.0–1.2)
CO2: 28 mmol/L (ref 20–29)
Calcium: 9.2 mg/dL (ref 8.7–10.3)
Chloride: 102 mmol/L (ref 96–106)
Creatinine, Ser: 1.26 mg/dL — ABNORMAL HIGH (ref 0.57–1.00)
Globulin, Total: 3.7 g/dL (ref 1.5–4.5)
Glucose: 118 mg/dL — ABNORMAL HIGH (ref 70–99)
Potassium: 5.3 mmol/L — ABNORMAL HIGH (ref 3.5–5.2)
Sodium: 141 mmol/L (ref 134–144)
Total Protein: 7.2 g/dL (ref 6.0–8.5)
eGFR: 44 mL/min/{1.73_m2} — ABNORMAL LOW (ref >59)

## 2022-04-18 LAB — CBC WITH DIFFERENTIAL/PLATELET
Basophils Absolute: 0.1 10*3/uL (ref 0.0–0.2)
Basos: 1 %
EOS (ABSOLUTE): 0.1 10*3/uL (ref 0.0–0.4)
Eos: 2 %
Hematocrit: 35.8 % (ref 34.0–46.6)
Hemoglobin: 11.6 g/dL (ref 11.1–15.9)
Immature Grans (Abs): 0 10*3/uL (ref 0.0–0.1)
Immature Granulocytes: 0 %
Lymphocytes Absolute: 2.1 10*3/uL (ref 0.7–3.1)
Lymphs: 36 %
MCH: 28.8 pg (ref 26.6–33.0)
MCHC: 32.4 g/dL (ref 31.5–35.7)
MCV: 89 fL (ref 79–97)
Monocytes Absolute: 0.5 10*3/uL (ref 0.1–0.9)
Monocytes: 9 %
Neutrophils Absolute: 3 10*3/uL (ref 1.4–7.0)
Neutrophils: 52 %
Platelets: 302 10*3/uL (ref 150–450)
RBC: 4.03 x10E6/uL (ref 3.77–5.28)
RDW: 12.7 % (ref 11.7–15.4)
WBC: 5.8 10*3/uL (ref 3.4–10.8)

## 2022-04-18 LAB — LIPID PANEL
Chol/HDL Ratio: 3.5 ratio (ref 0.0–4.4)
Cholesterol, Total: 130 mg/dL (ref 100–199)
HDL: 37 mg/dL — ABNORMAL LOW (ref >39)
LDL Chol Calc (NIH): 76 mg/dL (ref 0–99)
Triglycerides: 85 mg/dL (ref 0–149)
VLDL Cholesterol Cal: 17 mg/dL (ref 5–40)

## 2022-04-18 LAB — HEMOGLOBIN A1C
Est. average glucose Bld gHb Est-mCnc: 123 mg/dL
Hgb A1c MFr Bld: 5.9 % — ABNORMAL HIGH (ref 4.8–5.6)

## 2022-04-18 LAB — B12 AND FOLATE PANEL
Folate: 10.9 ng/mL (ref >3.0)
Vitamin B-12: 337 pg/mL (ref 232–1245)

## 2022-04-18 LAB — METHYLMALONIC ACID, SERUM: Methylmalonic Acid: 402 nmol/L — ABNORMAL HIGH (ref 0–378)

## 2022-04-18 LAB — CARDIOVASCULAR RISK ASSESSMENT

## 2022-04-18 LAB — TSH: TSH: 7.53 u[IU]/mL — ABNORMAL HIGH (ref 0.450–4.500)

## 2022-04-18 NOTE — Progress Notes (Signed)
Blood count normal.  Liver function abnormal. Stable.  Kidney function normal.  Thyroid function abnormal. Add free T4.  Cholesterol: good. Except HDL low. This is signficant change. Recommend eat health and exercise.  HBA1C: 5.9 MMA 402 still high so still needs to take B12. Either otc b12 2500 mg SL daily or weekly shots here in the office.  B12 improved and folate normal.

## 2022-04-19 LAB — T4, FREE: Free T4: 1.54 ng/dL (ref 0.82–1.77)

## 2022-04-19 LAB — SPECIMEN STATUS REPORT

## 2022-04-25 DIAGNOSIS — I129 Hypertensive chronic kidney disease with stage 1 through stage 4 chronic kidney disease, or unspecified chronic kidney disease: Secondary | ICD-10-CM | POA: Diagnosis not present

## 2022-04-25 DIAGNOSIS — N1831 Chronic kidney disease, stage 3a: Secondary | ICD-10-CM | POA: Diagnosis not present

## 2022-04-25 DIAGNOSIS — D631 Anemia in chronic kidney disease: Secondary | ICD-10-CM | POA: Diagnosis not present

## 2022-04-25 DIAGNOSIS — E876 Hypokalemia: Secondary | ICD-10-CM | POA: Diagnosis not present

## 2022-04-26 DIAGNOSIS — R0602 Shortness of breath: Secondary | ICD-10-CM | POA: Diagnosis not present

## 2022-04-26 DIAGNOSIS — G4733 Obstructive sleep apnea (adult) (pediatric): Secondary | ICD-10-CM | POA: Diagnosis not present

## 2022-04-27 DIAGNOSIS — G4733 Obstructive sleep apnea (adult) (pediatric): Secondary | ICD-10-CM | POA: Diagnosis not present

## 2022-04-27 DIAGNOSIS — R0602 Shortness of breath: Secondary | ICD-10-CM | POA: Diagnosis not present

## 2022-05-02 DIAGNOSIS — H5203 Hypermetropia, bilateral: Secondary | ICD-10-CM | POA: Diagnosis not present

## 2022-05-06 ENCOUNTER — Other Ambulatory Visit: Payer: Self-pay

## 2022-05-06 DIAGNOSIS — R400 Somnolence: Secondary | ICD-10-CM

## 2022-05-25 DIAGNOSIS — D485 Neoplasm of uncertain behavior of skin: Secondary | ICD-10-CM | POA: Diagnosis not present

## 2022-05-25 DIAGNOSIS — L668 Other cicatricial alopecia: Secondary | ICD-10-CM | POA: Diagnosis not present

## 2022-05-25 DIAGNOSIS — L821 Other seborrheic keratosis: Secondary | ICD-10-CM | POA: Diagnosis not present

## 2022-05-25 DIAGNOSIS — L820 Inflamed seborrheic keratosis: Secondary | ICD-10-CM | POA: Diagnosis not present

## 2022-05-26 ENCOUNTER — Encounter: Payer: Self-pay | Admitting: Family Medicine

## 2022-05-26 ENCOUNTER — Ambulatory Visit (INDEPENDENT_AMBULATORY_CARE_PROVIDER_SITE_OTHER): Payer: Medicare Other | Admitting: Family Medicine

## 2022-05-26 VITALS — BP 92/60 | HR 74 | Temp 97.3°F | Resp 14 | Ht 60.5 in | Wt 210.0 lb

## 2022-05-26 DIAGNOSIS — R7989 Other specified abnormal findings of blood chemistry: Secondary | ICD-10-CM | POA: Insufficient documentation

## 2022-05-26 DIAGNOSIS — E538 Deficiency of other specified B group vitamins: Secondary | ICD-10-CM

## 2022-05-26 DIAGNOSIS — E876 Hypokalemia: Secondary | ICD-10-CM | POA: Diagnosis not present

## 2022-05-26 DIAGNOSIS — I129 Hypertensive chronic kidney disease with stage 1 through stage 4 chronic kidney disease, or unspecified chronic kidney disease: Secondary | ICD-10-CM

## 2022-05-26 DIAGNOSIS — N1832 Chronic kidney disease, stage 3b: Secondary | ICD-10-CM

## 2022-05-26 NOTE — Assessment & Plan Note (Signed)
Check b12 and mma level

## 2022-05-26 NOTE — Assessment & Plan Note (Signed)
Check tsh, free t4.

## 2022-05-26 NOTE — Progress Notes (Signed)
Subjective:  Patient ID: Lisa Ortega, female    DOB: 08-29-1946  Age: 76 y.o. MRN: [id number]  Chief Complaint  Patient presents with   Hypertension   Hyperlipidemia   Chronic Kidney Disease    HPI Hyperlipidemia:  Patient is currently taking Rosuvastatin 5 mg take 1 tablet daily.   Hypertension with CKD: Patient is currently taking Atenolol 50 mg take 1 tablet twice daily, Losartan 25 mg take 1 tablet daily.  Hypokalemia: Taking potassium chloride 20 meq 1/2 daily. Patient sees  Dr. Gara Kroner, nephrologist at [facility] annually.   Prediabetes: Last A1c: 5.9  Does not eat healthy or exercise.   Memory loss: saw neurology. MRI of brain showed mild atrophy and some vascular changes. Taking B12 lozenges.  TSH elevated in 04/2022, but Free T4 was normal. Patient has telogen effluvium and saw dermatology yesterday.   Current Outpatient Medications on File Prior to Visit  Medication Sig Dispense Refill   Cyanocobalamin (VITAMIN B 12) 250 MCG LOZG Take by mouth.     losartan (COZAAR) 25 MG tablet Take 1 tablet by mouth daily.     albuterol (VENTOLIN HFA) 108 (90 Base) MCG/ACT inhaler Inhale 1 puff into the lungs every 6 (six) hours as needed for wheezing or shortness of breath.     atenolol (TENORMIN) 50 MG tablet Take 1 tablet (50 mg total) by mouth 2 (two) times daily. 180 tablet 1   Cholecalciferol (VITAMIN D3) 50 MCG (2000 UT) capsule Take 2,000 Units by mouth daily.     furosemide (LASIX) 20 MG tablet Take 1 tablet (20 mg total) by mouth daily. 90 tablet 1   KLOR-CON M20 20 MEQ tablet Take 20 mEq by mouth daily.     losartan (COZAAR) 25 MG tablet Take 1 tablet (25 mg total) by mouth daily. 90 tablet 1   rosuvastatin (CRESTOR) 5 MG tablet Take 1 tablet by mouth once daily 90 tablet 0   No current facility-administered medications on file prior to visit.   Past Medical History:  Diagnosis Date   Acute on chronic diastolic (congestive) heart failure (HCC)     Essential (primary) hypertension    Essential hypertension, benign 11/21/2019   Hypertensive chronic kidney disease with stage 1 through stage 4 chronic kidney disease, or unspecified chronic kidney disease    Impaired fasting glucose    Localized edema    Memory loss 03/03/2021   Mixed hyperlipidemia    Morbid (severe) obesity due to excess calories (HCC)    Obesity, Class III, BMI 40-49.9 (morbid obesity) ([place]) 11/23/2019   Other seasonal allergic rhinitis    Other specified nonscarring hair loss    Pain in left knee 01/21/2020   Telogen effluvium    Past Surgical History:  Procedure Laterality Date   CHOLECYSTECTOMY  04/01/2022   Dr Dian Queen   TONSILLECTOMY      Family History  Problem Relation Age of Onset   Transient ischemic attack Mother    Alzheimer's disease Mother    Stroke Father    Throat cancer Father    Breast cancer Sister    Pulmonary embolism Brother    Asthma Other    Diabetes type II Other    Hypertension Other    Hypothyroidism Other    Heart attack Other    Migraines Other    Osteoarthritis Other    Breast cancer Niece    Social History   Socioeconomic History   Marital status: Divorced    Spouse name: Not on  file   Number of children: 3   Years of education: Not on file   Highest education level: Not on file  Occupational History   Occupation: [employer]  Tobacco Use   Smoking status: Never   Smokeless tobacco: Never  Vaping Use   Vaping Use: Never used  Substance and Sexual Activity   Alcohol use: Never   Drug use: Never   Sexual activity: Not Currently    Birth control/protection: Post-menopausal  Other Topics Concern   Not on file  Social History Narrative   3 daughters, 5 grandchildren, 5 great-grandchildren   Right handed   One story home, couple of steps at door   Drinks caffeine   Social Determinants of Health   Financial Resource Strain: Not on file  Food Insecurity: No Food Insecurity (12/14/2021)   Hunger Vital Sign     Worried About Running Out of Food in the Last Year: Never true    Ran Out of Food in the Last Year: Never true  Transportation Needs: Not on file  Physical Activity: Inactive (12/14/2021)   Exercise Vital Sign    Days of Exercise per Week: 0 days    Minutes of Exercise per Session: 0 min  Stress: Not on file  Social Connections: Not on file    Review of Systems  Constitutional:  Negative for chills, fatigue and fever.  HENT:  Negative for congestion, rhinorrhea and sore throat.   Respiratory:  Negative for cough and shortness of breath.   Cardiovascular:  Negative for chest pain.  Gastrointestinal:  Negative for abdominal pain, constipation, diarrhea, nausea and vomiting.  Genitourinary:  Negative for dysuria and urgency.  Musculoskeletal:  Positive for back pain. Negative for myalgias.  Skin:        Hair loss   Neurological:  Negative for dizziness, weakness, light-headedness and headaches.  Psychiatric/Behavioral:  Negative for dysphoric mood. The patient is not nervous/anxious.      Objective:  BP 92/60   Pulse 74   Temp (!) 97.3 F (36.3 C)   Resp 14   Ht 5' 0.5" (1.537 m)   Wt 210 lb (95.3 kg)   BMI 40.34 kg/m      05/26/2022    9:01 AM 04/14/2022    9:04 AM 02/02/2022    3:30 AM  BP/Weight  Systolic BP 92 607 371  Diastolic BP 60 72 56  Wt. (Lbs) 210 215.2   BMI 40.34 kg/m2 39.36 kg/m2     Physical Exam Vitals reviewed.  Constitutional:      Appearance: Normal appearance. She is obese.  Neck:     Vascular: No carotid bruit.  Cardiovascular:     Rate and Rhythm: Normal rate and regular rhythm.     Heart sounds: Normal heart sounds.  Pulmonary:     Effort: Pulmonary effort is normal. No respiratory distress.     Breath sounds: Normal breath sounds.  Abdominal:     General: Abdomen is flat. Bowel sounds are normal.     Palpations: Abdomen is soft.     Tenderness: There is no abdominal tenderness.  Neurological:     Mental Status: She is alert and  oriented to person, place, and time.  Psychiatric:        Mood and Affect: Mood normal.        Behavior: Behavior normal.    Diabetic Foot Exam - Simple   No data filed      Lab Results  Component Value Date  WBC 5.8 04/14/2022   HGB 11.6 04/14/2022   HCT 35.8 04/14/2022   PLT 302 04/14/2022   GLUCOSE 118 (H) 04/14/2022   CHOL 130 04/14/2022   TRIG 85 04/14/2022   HDL 37 (L) 04/14/2022   LDLCALC 76 04/14/2022   ALT 16 04/14/2022   AST 29 04/14/2022   NA 141 04/14/2022   K 5.3 (H) 04/14/2022   CL 102 04/14/2022   CREATININE 1.26 (H) 04/14/2022   BUN 23 04/14/2022   CO2 28 04/14/2022   TSH 7.530 (H) 04/14/2022   HGBA1C 5.9 (H) 04/14/2022      Assessment & Plan:   Problem List Items Addressed This Visit       Genitourinary   Hypertensive kidney disease with stage 3b chronic kidney disease ([place])    BP low normal.  Decrease atenolol 50 mg 1/2 pill twice daily.  Check bp daily at home if able. Keep a log.  Follow up in 2 weeks for nurse visit for bp check.         Other   Morbid (severe) obesity due to excess calories ([place])    Recommend continue to work on eating healthy diet and exercise.       B12 deficiency    Check b12 and mma level      Relevant Orders   Vitamin B12   Methylmalonic acid, serum   Elevated TSH    Check tsh, free t4.      Relevant Orders   T4, free   TSH   Hypokalemia - Primary    Consider discontinuing based on CMP today.      Relevant Orders   Comprehensive metabolic panel  .  No orders of the defined types were placed in this encounter. Total time spent on today's visit was greater than 30 minutes, including both face-to-face time and nonface-to-face time personally spent on review of chart (labs and imaging), discussing labs and goals, discussing further work-up, treatment options, referrals to specialist if needed, reviewing outside records of pertinent, answering patient's questions, and coordinating care.   Orders  Placed This Encounter  Procedures   Comprehensive metabolic panel   Vitamin T61   Methylmalonic acid, serum   T4, free   TSH     Follow-up: Return in about 3 months (around 08/25/2022) for chronic fasting, Nurse visit in 2 weeks for bp check.  An After Visit Summary was printed and given to the patient.  Rochel Brome, MD [facility] [phone]

## 2022-05-26 NOTE — Assessment & Plan Note (Signed)
Recommend continue to work on eating healthy diet and exercise.  

## 2022-05-26 NOTE — Assessment & Plan Note (Addendum)
BP low normal.  Decrease atenolol 50 mg 1/2 pill twice daily.  Check bp daily at home if able. Keep a log.  Follow up in 2 weeks for nurse visit for bp check.

## 2022-05-26 NOTE — Patient Instructions (Signed)
Hypertension: Decrease atenolol 50 mg 1/2 pill twice daily.  Check bp daily at home if able. Keep a log.  Follow up in 2 weeks for nurse visit for bp check.

## 2022-05-26 NOTE — Assessment & Plan Note (Signed)
Consider discontinuing based on CMP today.

## 2022-05-30 LAB — COMPREHENSIVE METABOLIC PANEL
ALT: 12 IU/L (ref 0–32)
AST: 20 IU/L (ref 0–40)
Albumin/Globulin Ratio: 1.2 (ref 1.2–2.2)
Albumin: 3.9 g/dL (ref 3.8–4.8)
Alkaline Phosphatase: 109 IU/L (ref 44–121)
BUN/Creatinine Ratio: 18 (ref 12–28)
BUN: 24 mg/dL (ref 8–27)
Bilirubin Total: 0.5 mg/dL (ref 0.0–1.2)
CO2: 24 mmol/L (ref 20–29)
Calcium: 9.3 mg/dL (ref 8.7–10.3)
Chloride: 105 mmol/L (ref 96–106)
Creatinine, Ser: 1.3 mg/dL — ABNORMAL HIGH (ref 0.57–1.00)
Globulin, Total: 3.3 g/dL (ref 1.5–4.5)
Glucose: 131 mg/dL — ABNORMAL HIGH (ref 70–99)
Potassium: 4.8 mmol/L (ref 3.5–5.2)
Sodium: 144 mmol/L (ref 134–144)
Total Protein: 7.2 g/dL (ref 6.0–8.5)
eGFR: 42 mL/min/{1.73_m2} — ABNORMAL LOW (ref >59)

## 2022-05-30 LAB — TSH: TSH: 3.29 u[IU]/mL (ref 0.450–4.500)

## 2022-05-30 LAB — VITAMIN B12: Vitamin B-12: 1470 pg/mL — ABNORMAL HIGH (ref 232–1245)

## 2022-05-30 LAB — T4, FREE: Free T4: 1.51 ng/dL (ref 0.82–1.77)

## 2022-05-30 LAB — METHYLMALONIC ACID, SERUM: Methylmalonic Acid: 198 nmol/L (ref 0–378)

## 2022-05-31 NOTE — Progress Notes (Signed)
Kidney function stable Thyroid normal. B12 therapeutic.  Continue over-the-counter B12.

## 2022-06-02 ENCOUNTER — Other Ambulatory Visit: Payer: Self-pay | Admitting: Family Medicine

## 2022-07-17 ENCOUNTER — Other Ambulatory Visit: Payer: Self-pay | Admitting: Family Medicine

## 2022-07-30 DIAGNOSIS — J189 Pneumonia, unspecified organism: Secondary | ICD-10-CM | POA: Diagnosis not present

## 2022-07-30 DIAGNOSIS — R053 Chronic cough: Secondary | ICD-10-CM | POA: Diagnosis not present

## 2022-07-30 DIAGNOSIS — R0602 Shortness of breath: Secondary | ICD-10-CM | POA: Diagnosis not present

## 2022-07-30 DIAGNOSIS — R0989 Other specified symptoms and signs involving the circulatory and respiratory systems: Secondary | ICD-10-CM | POA: Diagnosis not present

## 2022-07-30 DIAGNOSIS — R051 Acute cough: Secondary | ICD-10-CM | POA: Diagnosis not present

## 2022-07-30 DIAGNOSIS — N189 Chronic kidney disease, unspecified: Secondary | ICD-10-CM | POA: Diagnosis not present

## 2022-08-03 ENCOUNTER — Other Ambulatory Visit: Payer: Self-pay

## 2022-08-03 DIAGNOSIS — I5032 Chronic diastolic (congestive) heart failure: Secondary | ICD-10-CM

## 2022-08-03 MED ORDER — FUROSEMIDE 20 MG PO TABS: 20.0000 mg | ORAL_TABLET | Freq: Every day | ORAL | 1 refills | Status: DC

## 2022-08-09 DIAGNOSIS — L650 Telogen effluvium: Secondary | ICD-10-CM | POA: Diagnosis not present

## 2022-08-10 ENCOUNTER — Ambulatory Visit (INDEPENDENT_AMBULATORY_CARE_PROVIDER_SITE_OTHER): Payer: Medicare Other | Admitting: Family Medicine

## 2022-08-10 VITALS — BP 110/64 | HR 71 | Temp 97.4°F | Ht 62.0 in | Wt 207.0 lb

## 2022-08-10 DIAGNOSIS — J410 Simple chronic bronchitis: Secondary | ICD-10-CM | POA: Diagnosis not present

## 2022-08-10 DIAGNOSIS — I5032 Chronic diastolic (congestive) heart failure: Secondary | ICD-10-CM | POA: Diagnosis not present

## 2022-08-10 DIAGNOSIS — L669 Cicatricial alopecia, unspecified: Secondary | ICD-10-CM

## 2022-08-10 NOTE — Progress Notes (Signed)
Acute Office Visit  Subjective:    Patient ID: Lisa Ortega, female    DOB: 22-Nov-1945, 76 y.o.   MRN: [id number]  Chief Complaint  Patient presents with   Follow-up    HPI: Patient is a 76 year old white female who presents for follow-up from her [name] urgent care.  She presented there on August 03, 2022 with shortness of breath and coughing.  No fevers.  On exam patient had rhonchi at the time.  She had a chest x-ray which showed some mild pulmonary edema and subsequent blood work showed a slightly elevated BNP.  CBC was normal.  CMP was stable.  Patient was given prednisone, an antibiotic which she is unsure of the name, and cough syrup.  Patient is feeling better. Cough is gone. Patient did not have worsening swelling in her legs.  Patient is in today to discuss lab results that were obtained by Urgent Care. Patient also recently started on finasteride and plaquenil for scarring alopecia.  Past Medical History:  Diagnosis Date   Acute on chronic diastolic (congestive) heart failure (HCC)    Essential (primary) hypertension    Essential hypertension, benign 11/21/2019   Hypertensive chronic kidney disease with stage 1 through stage 4 chronic kidney disease, or unspecified chronic kidney disease    Impaired fasting glucose    Localized edema    Memory loss 03/03/2021   Mixed hyperlipidemia    Morbid (severe) obesity due to excess calories (HCC)    Obesity, Class III, BMI 40-49.9 (morbid obesity) ([place]) 11/23/2019   Other seasonal allergic rhinitis    Other specified nonscarring hair loss    Pain in left knee 01/21/2020   Telogen effluvium     Past Surgical History:  Procedure Laterality Date   CHOLECYSTECTOMY  04/01/2022   Dr Dian Queen   TONSILLECTOMY      Family History  Problem Relation Age of Onset   Transient ischemic attack Mother    Alzheimer's disease Mother    Stroke Father    Throat cancer Father    Breast cancer Sister    Pulmonary embolism Brother     Asthma Other    Diabetes type II Other    Hypertension Other    Hypothyroidism Other    Heart attack Other    Migraines Other    Osteoarthritis Other    Breast cancer Niece     Social History   Socioeconomic History   Marital status: Divorced    Spouse name: Not on file   Number of children: 3   Years of education: Not on file   Highest education level: Not on file  Occupational History   Occupation: Environmental health practitioner  Tobacco Use   Smoking status: Never   Smokeless tobacco: Never  Vaping Use   Vaping Use: Never used  Substance and Sexual Activity   Alcohol use: Never   Drug use: Never   Sexual activity: Not Currently    Birth control/protection: Post-menopausal  Other Topics Concern   Not on file  Social History Narrative   3 daughters, 5 grandchildren, 5 great-grandchildren   Right handed   One story home, couple of steps at door   Drinks caffeine   Social Determinants of Health   Financial Resource Strain: Not on file  Food Insecurity: No Food Insecurity (12/14/2021)   Hunger Vital Sign    Worried About Running Out of Food in the Last Year: Never true    Kevin in the Last Year:  Never true  Transportation Needs: Not on file  Physical Activity: Inactive (12/14/2021)   Exercise Vital Sign    Days of Exercise per Week: 0 days    Minutes of Exercise per Session: 0 min  Stress: Not on file  Social Connections: Not on file  Intimate Partner Violence: Not on file    Outpatient Medications Prior to Visit  Medication Sig Dispense Refill   finasteride (PROSCAR) 5 MG tablet Take 5 mg by mouth daily.     hydroxychloroquine (PLAQUENIL) 200 MG tablet Take 200 mg by mouth 2 (two) times daily.     albuterol (VENTOLIN HFA) 108 (90 Base) MCG/ACT inhaler Inhale 1 puff into the lungs every 6 (six) hours as needed for wheezing or shortness of breath.     atenolol (TENORMIN) 50 MG tablet Take 1 tablet (50 mg total) by mouth 2 (two) times daily. 180 tablet 1    Cholecalciferol (VITAMIN D3) 50 MCG (2000 UT) capsule Take 2,000 Units by mouth daily.     Cyanocobalamin (VITAMIN B 12) 250 MCG LOZG Take by mouth.     furosemide (LASIX) 20 MG tablet Take 1 tablet (20 mg total) by mouth daily. 90 tablet 1   KLOR-CON M20 20 MEQ tablet Take 20 mEq by mouth daily.     losartan (COZAAR) 25 MG tablet Take 1 tablet by mouth once daily 90 tablet 0   rosuvastatin (CRESTOR) 5 MG tablet Take 1 tablet by mouth once daily 90 tablet 0   No facility-administered medications prior to visit.    Allergies  Allergen Reactions   Amlodipine Other (See Comments)    Alopecia   Lisinopril-Hydrochlorothiazide     Review of Systems  Constitutional:  Negative for chills, fatigue and fever.  HENT:  Negative for congestion, ear pain, rhinorrhea and sore throat.   Respiratory:  Negative for cough and shortness of breath.   Cardiovascular:  Negative for chest pain.  Gastrointestinal:  Negative for abdominal pain, constipation, diarrhea, nausea and vomiting.  Genitourinary:  Negative for dysuria and urgency.  Musculoskeletal:  Negative for back pain and myalgias.  Neurological:  Negative for dizziness, weakness, light-headedness and headaches.  Psychiatric/Behavioral:  Negative for dysphoric mood. The patient is not nervous/anxious.        Objective:    Physical Exam Vitals reviewed.  Constitutional:      Appearance: Normal appearance. She is obese.  Neck:     Vascular: No carotid bruit.  Cardiovascular:     Rate and Rhythm: Normal rate and regular rhythm.     Heart sounds: Normal heart sounds.  Pulmonary:     Effort: Pulmonary effort is normal. No respiratory distress.     Breath sounds: Normal breath sounds.  Abdominal:     General: Abdomen is flat. Bowel sounds are normal.     Palpations: Abdomen is soft.     Tenderness: There is no abdominal tenderness.  Musculoskeletal:     Right lower leg: No edema.     Left lower leg: No edema.  Neurological:     Mental  Status: She is alert and oriented to person, place, and time.  Psychiatric:        Mood and Affect: Mood normal.        Behavior: Behavior normal.   Hair coming in.  BP 110/64   Pulse 71   Temp (!) 97.4 F (36.3 C)   Ht _0  (1.575 m)   Wt 207 lb (93.9 kg)   SpO2 100%  BMI 37.86 kg/m  Wt Readings from Last 3 Encounters:  08/10/22 207 lb (93.9 kg)  05/26/22 210 lb (95.3 kg)  04/14/22 215 lb 3.2 oz (97.6 kg)    Health Maintenance Due  Topic Date Due   DTaP/Tdap/Td (1 - Tdap) Never done   Zoster Vaccines- Shingrix (1 of 2) Never done    There are no preventive care reminders to display for this patient.   Lab Results  Component Value Date   TSH 3.290 05/26/2022   Lab Results  Component Value Date   WBC 5.8 04/14/2022   HGB 11.6 04/14/2022   HCT 35.8 04/14/2022   MCV 89 04/14/2022   PLT 302 04/14/2022   Lab Results  Component Value Date   NA 147 (H) 08/10/2022   K 4.1 08/10/2022   CO2 28 08/10/2022   GLUCOSE 100 (H) 08/10/2022   BUN 26 08/10/2022   CREATININE 1.17 (H) 08/10/2022   BILITOT 0.8 08/10/2022   ALKPHOS 92 08/10/2022   AST 18 08/10/2022   ALT 8 08/10/2022   PROT 6.8 08/10/2022   ALBUMIN 3.7 (L) 08/10/2022   CALCIUM 8.7 08/10/2022   ANIONGAP 7 02/02/2022   EGFR 48 (L) 08/10/2022   Lab Results  Component Value Date   CHOL 130 04/14/2022   Lab Results  Component Value Date   HDL 37 (L) 04/14/2022   Lab Results  Component Value Date   LDLCALC 76 04/14/2022   Lab Results  Component Value Date   TRIG 85 04/14/2022   Lab Results  Component Value Date   CHOLHDL 3.5 04/14/2022   Lab Results  Component Value Date   HGBA1C 5.9 (H) 04/14/2022       Assessment & Plan:   Problem List Items Addressed This Visit       Cardiovascular and Mediastinum   Chronic diastolic congestive heart failure (HCC)    Continue losartan and lasix.      Relevant Orders   Comprehensive metabolic panel (Completed)   Pro b natriuretic peptide (BNP)  (Completed)     Respiratory   Chronic bronchitis (HCC)    Completed prednisone, antibiotic and cough syrup as needed.         Musculoskeletal and Integument   Scarring alopecia - Primary    Continue plaquenil and finasteride.      No orders of the defined types were placed in this encounter.   Orders Placed This Encounter  Procedures   Comprehensive metabolic panel   Pro b natriuretic peptide (BNP)     Follow-up: Return if symptoms worsen or fail to improve.  An After Visit Summary was printed and given to the patient.  Rochel Brome, MD [facility] [phone]

## 2022-08-11 LAB — COMPREHENSIVE METABOLIC PANEL
ALT: 8 IU/L (ref 0–32)
AST: 18 IU/L (ref 0–40)
Albumin/Globulin Ratio: 1.2 (ref 1.2–2.2)
Albumin: 3.7 g/dL — ABNORMAL LOW (ref 3.8–4.8)
Alkaline Phosphatase: 92 IU/L (ref 44–121)
BUN/Creatinine Ratio: 22 (ref 12–28)
BUN: 26 mg/dL (ref 8–27)
Bilirubin Total: 0.8 mg/dL (ref 0.0–1.2)
CO2: 28 mmol/L (ref 20–29)
Calcium: 8.7 mg/dL (ref 8.7–10.3)
Chloride: 105 mmol/L (ref 96–106)
Creatinine, Ser: 1.17 mg/dL — ABNORMAL HIGH (ref 0.57–1.00)
Globulin, Total: 3.1 g/dL (ref 1.5–4.5)
Glucose: 100 mg/dL — ABNORMAL HIGH (ref 70–99)
Potassium: 4.1 mmol/L (ref 3.5–5.2)
Sodium: 147 mmol/L — ABNORMAL HIGH (ref 134–144)
Total Protein: 6.8 g/dL (ref 6.0–8.5)
eGFR: 48 mL/min/{1.73_m2} — ABNORMAL LOW (ref >59)

## 2022-08-11 LAB — PRO B NATRIURETIC PEPTIDE: NT-Pro BNP: 549 pg/mL (ref 0–738)

## 2022-08-11 NOTE — Progress Notes (Signed)
Probnp normal. No evidence of chf on labs.  Liver function normal.  Kidney function abnormal, but improved.

## 2022-08-14 ENCOUNTER — Encounter: Payer: Self-pay | Admitting: Family Medicine

## 2022-08-14 DIAGNOSIS — J42 Unspecified chronic bronchitis: Secondary | ICD-10-CM | POA: Insufficient documentation

## 2022-08-14 DIAGNOSIS — L669 Cicatricial alopecia, unspecified: Secondary | ICD-10-CM | POA: Insufficient documentation

## 2022-08-14 NOTE — Assessment & Plan Note (Signed)
Completed prednisone, antibiotic and cough syrup as needed.

## 2022-08-14 NOTE — Assessment & Plan Note (Signed)
Continue losartan and lasix.

## 2022-08-14 NOTE — Assessment & Plan Note (Signed)
Continue plaquenil and finasteride.

## 2022-08-31 ENCOUNTER — Encounter: Payer: Self-pay | Admitting: Family Medicine

## 2022-08-31 ENCOUNTER — Ambulatory Visit (INDEPENDENT_AMBULATORY_CARE_PROVIDER_SITE_OTHER): Payer: Medicare Other | Admitting: Family Medicine

## 2022-08-31 VITALS — BP 138/62 | HR 72 | Temp 97.2°F | Ht 62.0 in | Wt 207.0 lb

## 2022-08-31 DIAGNOSIS — R400 Somnolence: Secondary | ICD-10-CM | POA: Diagnosis not present

## 2022-08-31 DIAGNOSIS — I129 Hypertensive chronic kidney disease with stage 1 through stage 4 chronic kidney disease, or unspecified chronic kidney disease: Secondary | ICD-10-CM | POA: Diagnosis not present

## 2022-08-31 DIAGNOSIS — E782 Mixed hyperlipidemia: Secondary | ICD-10-CM | POA: Diagnosis not present

## 2022-08-31 DIAGNOSIS — Z6837 Body mass index (BMI) 37.0-37.9, adult: Secondary | ICD-10-CM

## 2022-08-31 DIAGNOSIS — N1832 Chronic kidney disease, stage 3b: Secondary | ICD-10-CM | POA: Diagnosis not present

## 2022-08-31 DIAGNOSIS — E538 Deficiency of other specified B group vitamins: Secondary | ICD-10-CM

## 2022-08-31 DIAGNOSIS — R7301 Impaired fasting glucose: Secondary | ICD-10-CM | POA: Diagnosis not present

## 2022-08-31 NOTE — Patient Instructions (Addendum)
Recommend continue to work on eating healthy diet and exercise. No changes to medicines.   Please be sure you see an eye doctor every 6 months. This is recommended when on plaquenil. Thank you.   Merry Christmas!! Happy New Year!  Dr. Tobie Poet

## 2022-08-31 NOTE — Progress Notes (Signed)
Subjective:  Patient ID: Lisa Ortega, female    DOB: 07-May-1946  Age: 76 y.o. MRN: [id number]  Chief Complaint  Patient presents with   Hypertension   Hyperlipidemia   HPI: Hyperlipidemia:  Patient is currently taking Rosuvastatin 5 mg take 1 tablet daily.   Hypertension with CKD: Patient is currently taking Atenolol 50 mg take 1 tablet twice daily, Losartan 25 mg take 1 tablet daily.  Hypokalemia: Taking potassium chloride 20 meq 1/2 daily. Patient sees  Dr. Gara Kroner, nephrologist at [facility] annually.   Prediabetes: Last A1c: 5.9  Does not eat healthy or exercise.   Memory loss: saw neurology. MRI of brain showed mild atrophy and some vascular changes. Taking B12 lozenges.  TSH elevated in 04/2022, but Free T4 was normal. Patient has telogen effluvium and saw dermatology yesterday.    Current Outpatient Medications on File Prior to Visit  Medication Sig Dispense Refill   albuterol (VENTOLIN HFA) 108 (90 Base) MCG/ACT inhaler Inhale 1 puff into the lungs every 6 (six) hours as needed for wheezing or shortness of breath.     atenolol (TENORMIN) 50 MG tablet Take 1 tablet (50 mg total) by mouth 2 (two) times daily. 180 tablet 1   Cholecalciferol (VITAMIN D3) 50 MCG (2000 UT) capsule Take 2,000 Units by mouth daily.     Cyanocobalamin (VITAMIN B 12) 250 MCG LOZG Take by mouth.     finasteride (PROSCAR) 5 MG tablet Take 5 mg by mouth daily.     furosemide (LASIX) 20 MG tablet Take 1 tablet (20 mg total) by mouth daily. 90 tablet 1   hydroxychloroquine (PLAQUENIL) 200 MG tablet Take 200 mg by mouth 2 (two) times daily.     KLOR-CON M20 20 MEQ tablet Take 20 mEq by mouth daily.     losartan (COZAAR) 25 MG tablet Take 1 tablet by mouth once daily 90 tablet 0   No current facility-administered medications on file prior to visit.   Past Medical History:  Diagnosis Date   Acute on chronic diastolic (congestive) heart failure (HCC)    Essential (primary) hypertension     Essential hypertension, benign 11/21/2019   Hypertensive chronic kidney disease with stage 1 through stage 4 chronic kidney disease, or unspecified chronic kidney disease    Impaired fasting glucose    Localized edema    Memory loss 03/03/2021   Mixed hyperlipidemia    Morbid (severe) obesity due to excess calories (HCC)    Obesity, Class III, BMI 40-49.9 (morbid obesity) ([place]) 11/23/2019   Other seasonal allergic rhinitis    Other specified nonscarring hair loss    Pain in left knee 01/21/2020   Telogen effluvium    Past Surgical History:  Procedure Laterality Date   CHOLECYSTECTOMY  04/01/2022   Dr Dian Queen   TONSILLECTOMY      Family History  Problem Relation Age of Onset   Transient ischemic attack Mother    Alzheimer's disease Mother    Stroke Father    Throat cancer Father    Breast cancer Sister    Pulmonary embolism Brother    Asthma Other    Diabetes type II Other    Hypertension Other    Hypothyroidism Other    Heart attack Other    Migraines Other    Osteoarthritis Other    Breast cancer Niece    Social History   Socioeconomic History   Marital status: Divorced    Spouse name: Not on file   Number of children:  3   Years of education: Not on file   Highest education level: Not on file  Occupational History   Occupation: [employer]  Tobacco Use   Smoking status: Never   Smokeless tobacco: Never  Vaping Use   Vaping Use: Never used  Substance and Sexual Activity   Alcohol use: Never   Drug use: Never   Sexual activity: Not Currently    Birth control/protection: Post-menopausal  Other Topics Concern   Not on file  Social History Narrative   3 daughters, 5 grandchildren, 5 great-grandchildren   Right handed   One story home, couple of steps at door   Drinks caffeine   Social Determinants of Health   Financial Resource Strain: Not on file  Food Insecurity: No Food Insecurity (12/14/2021)   Hunger Vital Sign    Worried About Running Out of  Food in the Last Year: Never true    Ran Out of Food in the Last Year: Never true  Transportation Needs: Not on file  Physical Activity: Inactive (12/14/2021)   Exercise Vital Sign    Days of Exercise per Week: 0 days    Minutes of Exercise per Session: 0 min  Stress: Not on file  Social Connections: Not on file    Review of Systems  Constitutional:  Negative for appetite change, fatigue and fever.  HENT:  Negative for congestion, ear pain, sinus pressure and sore throat.   Respiratory:  Negative for cough, chest tightness, shortness of breath and wheezing.   Cardiovascular:  Negative for chest pain and palpitations.  Gastrointestinal:  Negative for abdominal pain, constipation, diarrhea, nausea and vomiting.  Genitourinary:  Negative for dysuria and hematuria.  Musculoskeletal:  Negative for arthralgias, back pain, joint swelling and myalgias.  Skin:  Negative for rash.  Neurological:  Negative for dizziness, weakness and headaches.  Psychiatric/Behavioral:  Negative for dysphoric mood. The patient is not nervous/anxious.      Objective:  BP 138/62 (BP Location: Left Arm, Patient Position: Sitting)   Pulse 72   Temp (!) 97.2 F (36.2 C) (Temporal)   Ht '5\' 2"'$  (1.575 m)   Wt 207 lb (93.9 kg)   SpO2 97%   BMI 37.86 kg/m      08/31/2022    9:47 AM 08/10/2022    1:35 PM 05/26/2022    9:01 AM  BP/Weight  Systolic BP 458 099 92  Diastolic BP 62 64 60  Wt. (Lbs) 207 207 210  BMI 37.86 kg/m2 37.86 kg/m2 40.34 kg/m2    Physical Exam Vitals reviewed.  Constitutional:      Appearance: Normal appearance. She is normal weight.  Cardiovascular:     Rate and Rhythm: Normal rate and regular rhythm.     Heart sounds: Normal heart sounds.  Pulmonary:     Effort: Pulmonary effort is normal.     Breath sounds: Normal breath sounds.  Abdominal:     General: Abdomen is flat. Bowel sounds are normal.     Palpations: Abdomen is soft.     Tenderness: There is no abdominal tenderness.   Neurological:     Mental Status: She is alert and oriented to person, place, and time.  Psychiatric:        Mood and Affect: Mood normal.        Behavior: Behavior normal.     Diabetic Foot Exam - Simple   No data filed      Lab Results  Component Value Date   WBC 5.8 04/14/2022  HGB 11.6 04/14/2022   HCT 35.8 04/14/2022   PLT 302 04/14/2022   GLUCOSE 111 (H) 08/31/2022   CHOL 125 08/31/2022   TRIG 81 08/31/2022   HDL 59 08/31/2022   LDLCALC 50 08/31/2022   ALT 7 08/31/2022   AST 20 08/31/2022   NA 144 08/31/2022   K 4.8 08/31/2022   CL 105 08/31/2022   CREATININE 1.22 (H) 08/31/2022   BUN 18 08/31/2022   CO2 25 08/31/2022   TSH 3.290 05/26/2022   HGBA1C 6.0 (H) 08/31/2022      Assessment & Plan:   Problem List Items Addressed This Visit       Endocrine   Impaired fasting glucose    Recommend continue to work on eating healthy diet and exercise.       Relevant Orders   Hemoglobin A1c (Completed)     Genitourinary   Hypertensive kidney disease with stage 3b chronic kidney disease ([place]) - Primary    Well controlled.  No changes to medicines. Continue Atenolol 50 mg take 1 tablet twice daily, Losartan 25 mg take 1 tablet daily. Continue to work on eating a healthy diet and exercise.  Labs drawn today.        Relevant Orders   Comprehensive metabolic panel (Completed)   Cardiovascular Risk Assessment (Completed)     Other   Mixed hyperlipidemia    Well controlled.  No changes to medicines. Continue rosuvastatin 5 mg one before bed.  Continue to work on eating a healthy diet and exercise.  Labs drawn today.        Relevant Orders   Lipid panel (Completed)   Daytime somnolence    Consider sleep study.      B12 deficiency    Continue B12 lozenges once daily.      Class 2 severe obesity due to excess calories with serious comorbidity and body mass index (BMI) of 37.0 to 37.9 in adult [facility])    Recommend continue to work on eating healthy  diet and exercise. Comorbidities include prediabetes, hyperlipidemia, and hypertension.     .  No orders of the defined types were placed in this encounter.   Orders Placed This Encounter  Procedures   Lipid panel   Hemoglobin A1c   Comprehensive metabolic panel   Cardiovascular Risk Assessment     Follow-up: Return in about 4 months (around 12/31/2022) for awv with KIM and Chronic fasting with Dr. Tobie Poet in 6 months fasting. Marland Kitchen  An After Visit Summary was printed and given to the patient.  I,Lauren M Auman,acting as a scribe for Rochel Brome, MD.,have documented all relevant documentation on the behalf of Rochel Brome, MD,as directed by  Rochel Brome, MD while in the presence of Rochel Brome, MD.    Rochel Brome, MD [place] [phone]

## 2022-09-01 LAB — HEMOGLOBIN A1C
Est. average glucose Bld gHb Est-mCnc: 126 mg/dL
Hgb A1c MFr Bld: 6 % — ABNORMAL HIGH (ref 4.8–5.6)

## 2022-09-01 LAB — COMPREHENSIVE METABOLIC PANEL
ALT: 7 IU/L (ref 0–32)
AST: 20 IU/L (ref 0–40)
Albumin/Globulin Ratio: 1.5 (ref 1.2–2.2)
Albumin: 4.2 g/dL (ref 3.8–4.8)
Alkaline Phosphatase: 102 IU/L (ref 44–121)
BUN/Creatinine Ratio: 15 (ref 12–28)
BUN: 18 mg/dL (ref 8–27)
Bilirubin Total: 0.5 mg/dL (ref 0.0–1.2)
CO2: 25 mmol/L (ref 20–29)
Calcium: 9.2 mg/dL (ref 8.7–10.3)
Chloride: 105 mmol/L (ref 96–106)
Creatinine, Ser: 1.22 mg/dL — ABNORMAL HIGH (ref 0.57–1.00)
Globulin, Total: 2.8 g/dL (ref 1.5–4.5)
Glucose: 111 mg/dL — ABNORMAL HIGH (ref 70–99)
Potassium: 4.8 mmol/L (ref 3.5–5.2)
Sodium: 144 mmol/L (ref 134–144)
Total Protein: 7 g/dL (ref 6.0–8.5)
eGFR: 46 mL/min/{1.73_m2} — ABNORMAL LOW (ref >59)

## 2022-09-01 LAB — CARDIOVASCULAR RISK ASSESSMENT

## 2022-09-01 LAB — LIPID PANEL
Chol/HDL Ratio: 2.1 ratio (ref 0.0–4.4)
Cholesterol, Total: 125 mg/dL (ref 100–199)
HDL: 59 mg/dL (ref >39)
LDL Chol Calc (NIH): 50 mg/dL (ref 0–99)
Triglycerides: 81 mg/dL (ref 0–149)
VLDL Cholesterol Cal: 16 mg/dL (ref 5–40)

## 2022-09-01 NOTE — Progress Notes (Signed)
Blood count normal.  Liver function normal.  Kidney function stable.  Cholesterol: good HBA1C: 6.0.

## 2022-09-02 ENCOUNTER — Other Ambulatory Visit: Payer: Self-pay | Admitting: Family Medicine

## 2022-09-06 DIAGNOSIS — E66812 Obesity, class 2: Secondary | ICD-10-CM | POA: Insufficient documentation

## 2022-09-06 NOTE — Assessment & Plan Note (Signed)
Recommend continue to work on eating healthy diet and exercise. Comorbidities include prediabetes, hyperlipidemia, and hypertension.

## 2022-09-06 NOTE — Assessment & Plan Note (Signed)
Consider sleep study.

## 2022-09-06 NOTE — Assessment & Plan Note (Signed)
Recommend continue to work on eating healthy diet and exercise.  

## 2022-09-06 NOTE — Assessment & Plan Note (Signed)
Well controlled.  No changes to medicines. Continue rosuvastatin 5 mg one before bed.  Continue to work on eating a healthy diet and exercise.  Labs drawn today.

## 2022-09-06 NOTE — Assessment & Plan Note (Signed)
Well controlled.  No changes to medicines. Continue Atenolol 50 mg take 1 tablet twice daily, Losartan 25 mg take 1 tablet daily. Continue to work on eating a healthy diet and exercise.  Labs drawn today.

## 2022-09-06 NOTE — Assessment & Plan Note (Signed)
Continue B12 lozenges once daily.

## 2022-09-14 ENCOUNTER — Other Ambulatory Visit: Payer: Self-pay

## 2022-09-14 MED ORDER — ATENOLOL 50 MG PO TABS: 50.0000 mg | ORAL_TABLET | Freq: Two times a day (BID) | ORAL | 1 refills | Status: DC

## 2022-10-15 ENCOUNTER — Other Ambulatory Visit: Payer: Self-pay | Admitting: Family Medicine

## 2022-11-15 DIAGNOSIS — K080 Exfoliation of teeth due to systemic causes: Secondary | ICD-10-CM | POA: Diagnosis not present

## 2022-11-21 ENCOUNTER — Other Ambulatory Visit: Payer: Self-pay | Admitting: Family Medicine

## 2022-11-21 DIAGNOSIS — Z1231 Encounter for screening mammogram for malignant neoplasm of breast: Secondary | ICD-10-CM

## 2022-12-20 ENCOUNTER — Ambulatory Visit (INDEPENDENT_AMBULATORY_CARE_PROVIDER_SITE_OTHER): Payer: Medicare Other

## 2022-12-20 DIAGNOSIS — Z0000 Encounter for general adult medical examination without abnormal findings: Secondary | ICD-10-CM | POA: Diagnosis not present

## 2022-12-20 NOTE — Progress Notes (Signed)
Subjective:   Lisa Ortega is a 77 y.o. female who presents for Medicare Annual (Subsequent) preventive examination.  I connected with  Lisa Ortega on 12/20/22 by a audio enabled telemedicine application and verified that I am speaking with the correct person using two identifiers.  Patient Location: Home  Provider Location: Office/Clinic  I discussed the limitations of evaluation and management by telemedicine. The patient expressed understanding and agreed to proceed.  Cardiac Risk Factors include: advanced age (>24men, >50 women);obesity (BMI >30kg/m2);hypertension;dyslipidemia     Objective:    There were no vitals filed for this visit. There is no height or weight on file to calculate BMI.     02/01/2022   11:52 PM  Advanced Directives  Does Patient Have a Medical Advance Directive? No    Current Medications (verified) Outpatient Encounter Medications as of 12/20/2022  Medication Sig   albuterol (VENTOLIN HFA) 108 (90 Base) MCG/ACT inhaler Inhale 1 puff into the lungs every 6 (six) hours as needed for wheezing or shortness of breath.   atenolol (TENORMIN) 50 MG tablet Take 1 tablet (50 mg total) by mouth 2 (two) times daily.   Cholecalciferol (VITAMIN D3) 50 MCG (2000 UT) capsule Take 2,000 Units by mouth daily.   Cyanocobalamin (VITAMIN B 12) 250 MCG LOZG Take by mouth.   finasteride (PROSCAR) 5 MG tablet Take 5 mg by mouth daily.   furosemide (LASIX) 20 MG tablet Take 1 tablet (20 mg total) by mouth daily.   hydroxychloroquine (PLAQUENIL) 200 MG tablet Take 200 mg by mouth 2 (two) times daily.   KLOR-CON M20 20 MEQ tablet Take 20 mEq by mouth daily.   losartan (COZAAR) 25 MG tablet Take 1 tablet by mouth once daily   rosuvastatin (CRESTOR) 5 MG tablet Take 1 tablet by mouth once daily   No facility-administered encounter medications on file as of 12/20/2022.    Allergies (verified) Amlodipine and Lisinopril-hydrochlorothiazide   History: Past Medical  History:  Diagnosis Date   Acute on chronic diastolic (congestive) heart failure    Essential (primary) hypertension    Essential hypertension, benign 11/21/2019   Hypertensive chronic kidney disease with stage 1 through stage 4 chronic kidney disease, or unspecified chronic kidney disease    Impaired fasting glucose    Localized edema    Memory loss 03/03/2021   Mixed hyperlipidemia    Morbid (severe) obesity due to excess calories    Obesity, Class III, BMI 40-49.9 (morbid obesity) 11/23/2019   Other seasonal allergic rhinitis    Other specified nonscarring hair loss    Pain in left knee 01/21/2020   Telogen effluvium    Past Surgical History:  Procedure Laterality Date   CHOLECYSTECTOMY  04/01/2022   Dr Cresenciano Genre   TONSILLECTOMY     Family History  Problem Relation Age of Onset   Transient ischemic attack Mother    Alzheimer's disease Mother    Stroke Father    Throat cancer Father    Breast cancer Sister    Pulmonary embolism Brother    Asthma Other    Diabetes type II Other    Hypertension Other    Hypothyroidism Other    Heart attack Other    Migraines Other    Osteoarthritis Other    Breast cancer Niece    Social History   Socioeconomic History   Marital status: Divorced    Spouse name: Not on file   Number of children: 3   Years of education: Not on file  Highest education level: Not on file  Occupational History   Occupation: [employer]  Tobacco Use   Smoking status: Never   Smokeless tobacco: Never  Vaping Use   Vaping Use: Never used  Substance and Sexual Activity   Alcohol use: Never   Drug use: Never   Sexual activity: Not Currently    Birth control/protection: Post-menopausal  Other Topics Concern   Not on file  Social History Narrative   3 daughters, 5 grandchildren, 5 great-grandchildren   Right handed   One story home, couple of steps at door   Drinks caffeine   Social Determinants of Health   Financial Resource Strain: Low Risk   (12/20/2022)   Overall Financial Resource Strain (CARDIA)    Difficulty of Paying Living Expenses: Not very hard  Food Insecurity: No Food Insecurity (12/20/2022)   Hunger Vital Sign    Worried About Running Out of Food in the Last Year: Never true    Ran Out of Food in the Last Year: Never true  Transportation Needs: No Transportation Needs (12/20/2022)   PRAPARE - Administrator, Civil Service (Medical): No    Lack of Transportation (Non-Medical): No  Physical Activity: Insufficiently Active (12/20/2022)   Exercise Vital Sign    Days of Exercise per Week: 2 days    Minutes of Exercise per Session: 20 min  Stress: No Stress Concern Present (12/20/2022)   [employer] of Occupational Health - Occupational Stress Questionnaire    Feeling of Stress : Not at all  Social Connections: Not on file    Tobacco Counseling Counseling given: Not Answered   Clinical Intake:  Pre-visit preparation completed: Yes Pain : No/denies pain   BMI - recorded: 37.85 Nutritional Status: BMI > 30  Obese Nutritional Risks: None Diabetes: No How often do you need to have someone help you when you read instructions, pamphlets, or other written materials from your doctor or pharmacy?: 2 - Rarely Interpreter Needed?: No    Activities of Daily Living    12/20/2022    1:30 PM  In your present state of health, do you have any difficulty performing the following activities:  Hearing? 1  Comment uses hearing aids  Vision? 0  Difficulty concentrating or making decisions? 0  Walking or climbing stairs? 0  Dressing or bathing? 0  Doing errands, shopping? 0  Preparing Food and eating ? N  Using the Toilet? N  In the past six months, have you accidently leaked urine? N  Do you have problems with loss of bowel control? N  Managing your Medications? N  Managing your Finances? N  Housekeeping or managing your Housekeeping? N    Patient Care Team: Blane Ohara, MD as PCP - General (Family  Medicine) Maxie Barb, MD as Consulting Physician (Nephrology)     Assessment:   This is a routine wellness examination for Lisa Ortega.  Hearing/Vision screen No results found.  Dietary issues and exercise activities discussed: Current Exercise Habits: Home exercise routine, Type of exercise: walking, Time (Minutes): 15, Frequency (Times/Week): 3, Weekly Exercise (Minutes/Week): 45, Intensity: Mild, Exercise limited by: None identified   Depression Screen    12/20/2022    1:35 PM 08/31/2022    9:52 AM 12/14/2021    2:13 PM 05/26/2021    9:17 AM 11/23/2020    9:03 AM 11/23/2019    8:16 PM  PHQ 2/9 Scores  PHQ - 2 Score 0 0 0 0 0 0    Fall Risk  12/20/2022    1:37 PM 08/31/2022    9:51 AM 12/14/2021    2:28 PM 05/26/2021    9:17 AM 11/23/2020    9:03 AM  Fall Risk   Falls in the past year? 0 0 0 0 0  Number falls in past yr: 0 0 0 0 0  Injury with Fall? 0 0 0 0 0  Risk for fall due to : No Fall Risks No Fall Risks No Fall Risks  No Fall Risks  Follow up Falls evaluation completed;Education provided Falls evaluation completed Falls evaluation completed;Falls prevention discussed Falls evaluation completed Falls evaluation completed    FALL RISK PREVENTION PERTAINING TO THE HOME:  Any stairs in or around the home? No  If so, are there any without handrails? No  Home free of loose throw rugs in walkways, pet beds, electrical cords, etc? Yes  Adequate lighting in your home to reduce risk of falls? Yes   ASSISTIVE DEVICES UTILIZED TO PREVENT FALLS:  Use of a cane, walker or w/c? No  Grab bars in the bathroom? No  Shower chair or bench in shower? No  Elevated toilet seat or a handicapped toilet? No   Cognitive Function:      03/03/2021    2:00 PM  Montreal Cognitive Assessment   Visuospatial/ Executive (0/5) 3  Naming (0/3) 3  Attention: Read list of digits (0/2) 2  Attention: Read list of letters (0/1) 1  Attention: Serial 7 subtraction starting at 100 (0/3) 3   Language: Repeat phrase (0/2) 2  Language : Fluency (0/1) 0  Abstraction (0/2) 2  Delayed Recall (0/5) 5  Orientation (0/6) 6  Total 27  Adjusted Score (based on education) 28      12/20/2022    1:38 PM 12/14/2021    2:29 PM  6CIT Screen  What Year? 0 points 0 points  What month? 0 points 0 points  What time? 0 points 0 points  Count back from 20 0 points 2 points  Months in reverse 2 points 4 points  Repeat phrase 2 points 4 points  Total Score 4 points 10 points    Immunizations Immunization History  Administered Date(s) Administered   Fluad Quad(high Dose 65+) 05/23/2019   Influenza,inj,quad, With Preservative 09/18/2019   Pneumococcal Conjugate-13 03/25/2015   Pneumococcal Polysaccharide-23 05/25/2016   Zoster, Live 03/31/2015    TDAP status: Due, Education has been provided regarding the importance of this vaccine. Advised may receive this vaccine at local pharmacy or Health Dept. Aware to provide a copy of the vaccination record if obtained from local pharmacy or Health Dept. Verbalized acceptance and understanding.  Flu Vaccine status: Declined, Education has been provided regarding the importance of this vaccine but patient still declined. Advised may receive this vaccine at local pharmacy or Health Dept. Aware to provide a copy of the vaccination record if obtained from local pharmacy or Health Dept. Verbalized acceptance and understanding.  Pneumococcal vaccine status: Up to date  Covid-19 vaccine status: Declined, Education has been provided regarding the importance of this vaccine but patient still declined. Advised may receive this vaccine at local pharmacy or Health Dept.or vaccine clinic. Aware to provide a copy of the vaccination record if obtained from local pharmacy or Health Dept. Verbalized acceptance and understanding.  Qualifies for Shingles Vaccine? Yes   Zostavax completed No   Shingrix Completed?: No.    Education has been provided regarding the  importance of this vaccine. Patient has been advised to call insurance company  to determine out of pocket expense if they have not yet received this vaccine. Advised may also receive vaccine at local pharmacy or Health Dept. Verbalized acceptance and understanding.  Screening Tests Health Maintenance  Topic Date Due   DTaP/Tdap/Td (1 - Tdap) Never done   Zoster Vaccines- Shingrix (1 of 2) Never done   MAMMOGRAM  12/03/2022   Medicare Annual Wellness (AWV)  12/15/2022   INFLUENZA VACCINE  04/13/2023   Pneumonia Vaccine 84+ Years old  Completed   DEXA SCAN  Completed   Hepatitis C Screening  Completed   HPV VACCINES  Aged Out   COVID-19 Vaccine  Discontinued   Fecal DNA (Cologuard)  Discontinued    Health Maintenance  Health Maintenance Due  Topic Date Due   DTaP/Tdap/Td (1 - Tdap) Never done   Zoster Vaccines- Shingrix (1 of 2) Never done   MAMMOGRAM  12/03/2022   Medicare Annual Wellness (AWV)  12/15/2022    Colorectal cancer screening: No longer required.   Mammogram status: Scheduled for 01/03/23  Bone Density status: Completed 07/2021. Results reflect: Bone density results: OSTEOPENIA. Repeat every 2 years.  Lung Cancer Screening: (Low Dose CT Chest recommended if Age 92-80 years, 30 pack-year currently smoking OR have quit w/in 15years.) does not qualify.   Additional Screening:  Vision Screening: Recommended annual ophthalmology exams for early detection of glaucoma and other disorders of the eye. Is the patient up to date with their annual eye exam?  Yes   Dental Screening: Recommended annual dental exams for proper oral hygiene  Community Resource Referral / Chronic Care Management: CRR required this visit?  No   CCM required this visit?  No      Plan:    1- Tetanus and Shingrix Vaccines recommended 2- Healthy diet and more exercise recommended  I have personally reviewed and noted the following in the patient's chart:   Medical and social history Use  of alcohol, tobacco or illicit drugs  Current medications and supplements including opioid prescriptions.  Functional ability and status Nutritional status Physical activity Advanced directives List of other physicians Hospitalizations, surgeries, and ER visits in previous 12 months Vitals Screenings to include cognitive, depression, and falls Referrals and appointments  In addition, I have reviewed and discussed with patient certain preventive protocols, quality metrics, and best practice recommendations. A written personalized care plan for preventive services as well as general preventive health recommendations were provided to patient.     Jacklynn Bue, LPN   01/15/9740

## 2022-12-20 NOTE — Addendum Note (Signed)
Addended byBlane Ohara on: 12/20/2022 10:29 PM   Modules accepted: Level of Service

## 2022-12-20 NOTE — Patient Instructions (Signed)
Lisa Ortega , Thank you for taking time to come for your Medicare Wellness Visit. I appreciate your ongoing commitment to your health goals. Please review the following plan we discussed and let me know if I can assist you in the future.   Screening recommendations/referrals: Mammogram: Scheduled 01/03/23 Bone Density: Due after 07/2023 Recommended yearly ophthalmology/optometry visit for glaucoma screening and checkup Recommended yearly dental visit for hygiene and checkup  Vaccinations: Influenza vaccine: Due fall 2024 Pneumococcal vaccine: up-to-date Tdap vaccine: Due - you can get this at the pharmacy Shingles vaccine: Due - you can get this at the pharmacy     Preventive Care 65 Years and Older, Female   Preventive care refers to lifestyle choices and visits with your health care provider that can promote health and wellness.  What does preventive care include? A yearly physical exam. This is also called an annual well check. Dental exams once or twice a year. Routine eye exams. Ask your health care provider how often you should have your eyes checked. Personal lifestyle choices, including: Daily care of your teeth and gums. Regular physical activity. Eating a healthy diet. Avoiding tobacco and drug use. Limiting alcohol use. Practicing safe sex. Taking low-dose aspirin every day. Taking vitamin and mineral supplements as recommended by your health care provider.  What happens during an annual well check? The services and screenings done by your health care provider during your annual well check will depend on your age, overall health, lifestyle risk factors, and family history of disease.  Counseling Your health care provider may ask you questions about your: Alcohol use. Tobacco use. Drug use. Emotional well-being. Home and relationship well-being. Sexual activity. Eating habits. History of falls. Memory and ability to understand (cognition). Work and work  Astronomer. Reproductive health.  Screening You may have the following tests or measurements: Height, weight, and BMI. Blood pressure. Lipid and cholesterol levels. These may be checked every 5 years, or more frequently if you are over 57 years old. Skin check. Lung cancer screening. You may have this screening every year starting at age 55 if you have a 30-pack-year history of smoking and currently smoke or have quit within the past 15 years. Fecal occult blood test (FOBT) of the stool. You may have this test every year starting at age 39. Flexible sigmoidoscopy or colonoscopy. You may have a sigmoidoscopy every 5 years or a colonoscopy every 10 years starting at age 21. Hepatitis C blood test. Hepatitis B blood test. Sexually transmitted disease (STD) testing. Diabetes screening. This is done by checking your blood sugar (glucose) after you have not eaten for a while (fasting). You may have this done every 1-3 years. Bone density scan. This is done to screen for osteoporosis. You may have this done starting at age 68. Mammogram. This may be done every 1-2 years. Talk to your health care provider about how often you should have regular mammograms. Talk with your health care provider about your test results, treatment options, and if necessary, the need for more tests.  Vaccines Your health care provider may recommend certain vaccines, such as: Influenza vaccine. This is recommended every year. Tetanus, diphtheria, and acellular pertussis (Tdap, Td) vaccine. You may need a Td booster every 10 years. Zoster vaccine. You may need this after age 48. Pneumococcal 13-valent conjugate (PCV13) vaccine. One dose is recommended after age 104. Pneumococcal polysaccharide (PPSV23) vaccine. One dose is recommended after age 67. Talk to your health care provider about which screenings and vaccines you  need and how often you need them.  This information is not intended to replace advice given to you by  your health care provider. Make sure you discuss any questions you have with your health care provider. Document Released: 09/25/2015 Document Revised: 05/18/2016 Document Reviewed: 06/30/2015 Elsevier Interactive Patient Education  2017 [place] Prevention in the Home  Falls can cause injuries. They can happen to people of all ages. There are many things you can do to make your home safe and to help prevent falls.  What can I do on the outside of my home? Regularly fix the edges of walkways and driveways and fix any cracks. Remove anything that might make you trip as you walk through a door, such as a raised step or threshold. Trim any bushes or trees on the path to your home. Use bright outdoor lighting. Clear any walking paths of anything that might make someone trip, such as rocks or tools. Regularly check to see if handrails are loose or broken. Make sure that both sides of any steps have handrails. Any raised decks and porches should have guardrails on the edges. Have any leaves, snow, or ice cleared regularly. Use sand or salt on walking paths during winter. Clean up any spills in your garage right away. This includes oil or grease spills.  What can I do in the bathroom? Use night lights. Install grab bars by the toilet and in the tub and shower. Do not use towel bars as grab bars. Use non-skid mats or decals in the tub or shower. If you need to sit down in the shower, use a plastic, non-slip stool. Keep the floor dry. Clean up any water that spills on the floor as soon as it happens. Remove soap buildup in the tub or shower regularly. Attach bath mats securely with double-sided non-slip rug tape. Do not have throw rugs and other things on the floor that can make you trip.  What can I do in the bedroom? Use night lights. Make sure that you have a light by your bed that is easy to reach. Do not use any sheets or blankets that are too big for your bed. They should  not hang down onto the floor. Have a firm chair that has side arms. You can use this for support while you get dressed. Do not have throw rugs and other things on the floor that can make you trip.  What can I do in the kitchen? Clean up any spills right away. Avoid walking on wet floors. Keep items that you use a lot in easy-to-reach places. If you need to reach something above you, use a strong step stool that has a grab bar. Keep electrical cords out of the way. Do not use floor polish or wax that makes floors slippery. If you must use wax, use non-skid floor wax. Do not have throw rugs and other things on the floor that can make you trip.  What can I do with my stairs? Do not leave any items on the stairs. Make sure that there are handrails on both sides of the stairs and use them. Fix handrails that are broken or loose. Make sure that handrails are as long as the stairways. Check any carpeting to make sure that it is firmly attached to the stairs. Fix any carpet that is loose or worn. Avoid having throw rugs at the top or bottom of the stairs. If you do have throw rugs, attach them to  the floor with carpet tape. Make sure that you have a light switch at the top of the stairs and the bottom of the stairs. If you do not have them, ask someone to add them for you.  What else can I do to help prevent falls? Wear shoes that: Do not have high heels. Have rubber bottoms. Are comfortable and fit you well. Are closed at the toe. Do not wear sandals. If you use a stepladder: Make sure that it is fully opened. Do not climb a closed stepladder. Make sure that both sides of the stepladder are locked into place. Ask someone to hold it for you, if possible. Clearly mark and make sure that you can see: Any grab bars or handrails. First and last steps. Where the edge of each step is. Use tools that help you move around (mobility aids) if they are needed. These  include: Canes. Walkers. Scooters. Crutches. Turn on the lights when you go into a dark area. Replace any light bulbs as soon as they burn out. Set up your furniture so you have a clear path. Avoid moving your furniture around. If any of your floors are uneven, fix them. If there are any pets around you, be aware of where they are. Review your medicines with your doctor. Some medicines can make you feel dizzy. This can increase your chance of falling. Ask your doctor what other things that you can do to help prevent falls.  This information is not intended to replace advice given to you by your health care provider. Make sure you discuss any questions you have with your health care provider. Document Released: 06/25/2009 Document Revised: 02/04/2016 Document Reviewed: 10/03/2014 Elsevier Interactive Patient Education  2017 [employer].

## 2023-01-03 ENCOUNTER — Ambulatory Visit
Admission: RE | Admit: 2023-01-03 | Discharge: 2023-01-03 | Disposition: A | Discharge status: Home / Self Care | Payer: Medicare Other | Source: Ambulatory Visit | Attending: Family Medicine | Admitting: Family Medicine

## 2023-01-03 DIAGNOSIS — Z1231 Encounter for screening mammogram for malignant neoplasm of breast: Secondary | ICD-10-CM | POA: Diagnosis not present

## 2023-01-11 ENCOUNTER — Other Ambulatory Visit: Payer: Self-pay | Admitting: Family Medicine

## 2023-01-23 ENCOUNTER — Other Ambulatory Visit: Payer: Self-pay | Admitting: Family Medicine

## 2023-01-23 DIAGNOSIS — I5032 Chronic diastolic (congestive) heart failure: Secondary | ICD-10-CM

## 2023-02-08 DIAGNOSIS — L638 Other alopecia areata: Secondary | ICD-10-CM | POA: Diagnosis not present

## 2023-02-16 DIAGNOSIS — H353130 Nonexudative age-related macular degeneration, bilateral, stage unspecified: Secondary | ICD-10-CM | POA: Diagnosis not present

## 2023-03-01 NOTE — Progress Notes (Signed)
Subjective:  Patient ID: Lisa Ortega, female    DOB: Feb 11, 1946  Age: 77 y.o. MRN: [id number]  Chief Complaint  Patient presents with   Medical Management of Chronic Issues    HPI   Hyperlipidemia:  Patient is currently taking Rosuvastatin 5 mg take 1 tablet daily.    Hypertension with CKD: Patient is currently taking Atenolol 50 mg take 1 tablet twice daily, Losartan 25 mg take 1 tablet daily, Lasix 20 mg daily   Hypokalemia: Taking potassium chloride 20 meq 1/2 daily. Patient sees  Dr. Larene Beach, nephrologist at [facility] annually.    Prediabetes: Last A1c: 6.0 Does not eat healthy or exercise.    Memory loss: saw neurology. MRI of brain showed mild atrophy and some vascular changes. Taking B12 lozenges.        12/20/2022    1:35 PM 08/31/2022    9:52 AM 12/14/2021    2:13 PM 05/26/2021    9:17 AM 11/23/2020    9:03 AM  Depression screen PHQ 2/9  Decreased Interest 0 0 0 0 0  Down, Depressed, Hopeless 0 0 0 0 0  PHQ - 2 Score 0 0 0 0 0        03/02/2023    9:15 AM  Fall Risk   Falls in the past year? 0  Number falls in past yr: 0  Injury with Fall? 0  Risk for fall due to : No Fall Risks;Impaired balance/gait  Follow up Falls evaluation completed    Patient Care Team: Blane Ohara, MD as PCP - General (Family Medicine) Maxie Barb, MD as Consulting Physician (Nephrology)   Review of Systems  Constitutional:  Negative for chills, fatigue and fever.  HENT:  Negative for congestion, ear pain, rhinorrhea and sore throat.   Respiratory:  Negative for cough and shortness of breath.   Cardiovascular:  Negative for chest pain.  Gastrointestinal:  Negative for abdominal pain, constipation, diarrhea, nausea and vomiting.  Genitourinary:  Negative for dysuria and urgency.  Musculoskeletal:  Positive for back pain. Negative for myalgias.  Neurological:  Negative for dizziness, weakness, light-headedness and headaches.  Psychiatric/Behavioral:   Negative for dysphoric mood. The patient is not nervous/anxious.     Current Outpatient Medications on File Prior to Visit  Medication Sig Dispense Refill   albuterol (VENTOLIN HFA) 108 (90 Base) MCG/ACT inhaler Inhale 1 puff into the lungs every 6 (six) hours as needed for wheezing or shortness of breath.     atenolol (TENORMIN) 50 MG tablet Take 1 tablet (50 mg total) by mouth 2 (two) times daily. 180 tablet 1   Cholecalciferol (VITAMIN D3) 50 MCG (2000 UT) capsule Take 2,000 Units by mouth daily.     Cyanocobalamin (VITAMIN B 12) 250 MCG LOZG Take by mouth.     finasteride (PROSCAR) 5 MG tablet Take 5 mg by mouth daily.     furosemide (LASIX) 20 MG tablet Take 1 tablet by mouth once daily 90 tablet 0   hydroxychloroquine (PLAQUENIL) 200 MG tablet Take 200 mg by mouth 2 (two) times daily.     KLOR-CON M20 20 MEQ tablet Take 20 mEq by mouth daily.     losartan (COZAAR) 25 MG tablet Take 1 tablet by mouth once daily 90 tablet 1   No current facility-administered medications on file prior to visit.   Past Medical History:  Diagnosis Date   Acute on chronic diastolic (congestive) heart failure (HCC)    Essential (primary) hypertension    Essential  hypertension, benign 11/21/2019   Hypertensive chronic kidney disease with stage 1 through stage 4 chronic kidney disease, or unspecified chronic kidney disease    Impaired fasting glucose    Localized edema    Memory loss 03/03/2021   Mixed hyperlipidemia    Morbid (severe) obesity due to excess calories (HCC)    Obesity, Class III, BMI 40-49.9 (morbid obesity) (HCC) 11/23/2019   Other seasonal allergic rhinitis    Other specified nonscarring hair loss    Pain in left knee 01/21/2020   Telogen effluvium    Past Surgical History:  Procedure Laterality Date   CHOLECYSTECTOMY  04/01/2022   Dr Cresenciano Genre   TONSILLECTOMY      Family History  Problem Relation Age of Onset   Transient ischemic attack Mother    Alzheimer's disease Mother     Stroke Father    Throat cancer Father    Breast cancer Sister    Pulmonary embolism Brother    Asthma Other    Diabetes type II Other    Hypertension Other    Hypothyroidism Other    Heart attack Other    Migraines Other    Osteoarthritis Other    Breast cancer Niece    Social History   Socioeconomic History   Marital status: Divorced    Spouse name: Not on file   Number of children: 3   Years of education: Not on file   Highest education level: Not on file  Occupational History   Occupation: Sports coach  Tobacco Use   Smoking status: Never   Smokeless tobacco: Never  Vaping Use   Vaping Use: Never used  Substance and Sexual Activity   Alcohol use: Never   Drug use: Never   Sexual activity: Not Currently    Birth control/protection: Post-menopausal  Other Topics Concern   Not on file  Social History Narrative   3 daughters, 5 grandchildren, 5 great-grandchildren   Right handed   One story home, couple of steps at door   Drinks caffeine   Social Determinants of Health   Financial Resource Strain: Low Risk  (12/20/2022)   Overall Financial Resource Strain (CARDIA)    Difficulty of Paying Living Expenses: Not very hard  Food Insecurity: No Food Insecurity (12/20/2022)   Hunger Vital Sign    Worried About Running Out of Food in the Last Year: Never true    Ran Out of Food in the Last Year: Never true  Transportation Needs: No Transportation Needs (12/20/2022)   PRAPARE - Administrator, Civil Service (Medical): No    Lack of Transportation (Non-Medical): No  Physical Activity: Insufficiently Active (12/20/2022)   Exercise Vital Sign    Days of Exercise per Week: 2 days    Minutes of Exercise per Session: 20 min  Stress: No Stress Concern Present (12/20/2022)   [employer] of Occupational Health - Occupational Stress Questionnaire    Feeling of Stress : Not at all  Social Connections: Socially Isolated (03/02/2023)   Social Connection and Isolation  Panel [NHANES]    Frequency of Communication with Friends and Family: Three times a week    Frequency of Social Gatherings with Friends and Family: Three times a week    Attends Religious Services: Never    Active Member of Clubs or Organizations: No    Attends Banker Meetings: Never    Marital Status: Divorced    Objective:  BP 136/78   Pulse 80   Temp (!) 96.8  F (36 C)   Ht 5\' 2"  (1.575 m)   Wt 213 lb (96.6 kg)   SpO2 98%   BMI 38.96 kg/m      03/02/2023    9:13 AM 08/31/2022    9:47 AM 08/10/2022    1:35 PM  BP/Weight  Systolic BP 136 138 110  Diastolic BP 78 62 64  Wt. (Lbs) 213 207 207  BMI 38.96 kg/m2 37.86 kg/m2 37.86 kg/m2    Physical Exam Vitals reviewed.  Constitutional:      Appearance: Normal appearance. She is obese.  Neck:     Vascular: No carotid bruit.  Cardiovascular:     Rate and Rhythm: Normal rate and regular rhythm.     Heart sounds: Normal heart sounds.  Pulmonary:     Effort: Pulmonary effort is normal. No respiratory distress.     Breath sounds: Normal breath sounds.  Abdominal:     General: Abdomen is flat. Bowel sounds are normal.     Palpations: Abdomen is soft.     Tenderness: There is no abdominal tenderness.  Neurological:     Mental Status: She is alert and oriented to person, place, and time.  Psychiatric:        Mood and Affect: Mood normal.        Behavior: Behavior normal.     Diabetic Foot Exam - Simple   No data filed      Lab Results  Component Value Date   WBC 6.5 03/02/2023   HGB 12.2 03/02/2023   HCT 37.3 03/02/2023   PLT 181 03/02/2023   GLUCOSE 113 (H) 03/02/2023   CHOL 128 03/02/2023   TRIG 54 03/02/2023   HDL 65 03/02/2023   LDLCALC 51 03/02/2023   ALT 9 03/02/2023   AST 18 03/02/2023   NA 145 (H) 03/02/2023   K 4.7 03/02/2023   CL 105 03/02/2023   CREATININE 1.26 (H) 03/02/2023   BUN 26 03/02/2023   CO2 25 03/02/2023   TSH 3.290 05/26/2022   HGBA1C 5.8 (H) 03/02/2023       Assessment & Plan:    Hypertensive kidney disease with stage 3b chronic kidney disease (HCC) Assessment & Plan: Well controlled.  No changes to medicines. Continue Atenolol 50 mg take 1 tablet twice daily, Losartan 25 mg take 1 tablet daily. Continue to work on eating a healthy diet and exercise.  Labs drawn today.    Orders: -     Comprehensive metabolic panel -     [facility]  Mixed hyperlipidemia Assessment & Plan: Well controlled.  No changes to medicines. Continue rosuvastatin 5 mg one before bed.  Continue to work on eating a healthy diet and exercise.  Labs drawn today.    Orders: -     Lipid panel -     Rosuvastatin Calcium; Take 1 tablet (5 mg total) by mouth daily.  Dispense: 90 tablet; Refill: 1  Impaired fasting glucose Assessment & Plan: Recommend continue to work on eating healthy diet and exercise.   Orders: -     CBC with Differential/Platelet -     Hemoglobin A1c  Chronic diastolic congestive heart failure (HCC) Assessment & Plan: Continue losartan and lasix.   BMI 38.0-38.9,adult Assessment & Plan: Recommend continue to work on eating healthy diet and exercise.       Meds ordered this encounter  Medications   rosuvastatin (CRESTOR) 5 MG tablet    Sig: Take 1 tablet (5 mg total) by mouth daily.  Dispense:  90 tablet    Refill:  1    Orders Placed This Encounter  Procedures   CBC with Differential/Platelet   Comprehensive metabolic panel   Lipid panel   Hemoglobin A1c   [facility]     Follow-up: No follow-ups on file.   I,Katherina A Bramblett,acting as a scribe for Blane Ohara, MD.,have documented all relevant documentation on the behalf of Blane Ohara, MD,as directed by  Blane Ohara, MD while in the presence of Blane Ohara, MD.   Clayborn Bigness I Leal-Borjas,acting as a scribe for Blane Ohara, MD.,have documented all relevant documentation on the behalf of Blane Ohara, MD,as directed by  Blane Ohara, MD  while in the presence of Blane Ohara, MD.    An After Visit Summary was printed and given to the patient.  I attest that I have reviewed this visit and agree with the plan scribed by my staff.   Blane Ohara, MD [facility] [phone]

## 2023-03-02 ENCOUNTER — Ambulatory Visit (INDEPENDENT_AMBULATORY_CARE_PROVIDER_SITE_OTHER): Payer: Medicare Other | Admitting: Family Medicine

## 2023-03-02 ENCOUNTER — Other Ambulatory Visit: Payer: Self-pay | Admitting: Family Medicine

## 2023-03-02 ENCOUNTER — Encounter: Payer: Self-pay | Admitting: Family Medicine

## 2023-03-02 VITALS — BP 136/78 | HR 80 | Temp 96.8°F | Ht 62.0 in | Wt 213.0 lb

## 2023-03-02 DIAGNOSIS — I5032 Chronic diastolic (congestive) heart failure: Secondary | ICD-10-CM | POA: Diagnosis not present

## 2023-03-02 DIAGNOSIS — E782 Mixed hyperlipidemia: Secondary | ICD-10-CM | POA: Diagnosis not present

## 2023-03-02 DIAGNOSIS — I129 Hypertensive chronic kidney disease with stage 1 through stage 4 chronic kidney disease, or unspecified chronic kidney disease: Secondary | ICD-10-CM

## 2023-03-02 DIAGNOSIS — N1832 Chronic kidney disease, stage 3b: Secondary | ICD-10-CM | POA: Diagnosis not present

## 2023-03-02 DIAGNOSIS — R7301 Impaired fasting glucose: Secondary | ICD-10-CM | POA: Diagnosis not present

## 2023-03-02 DIAGNOSIS — I130 Hypertensive heart and chronic kidney disease with heart failure and stage 1 through stage 4 chronic kidney disease, or unspecified chronic kidney disease: Secondary | ICD-10-CM

## 2023-03-02 DIAGNOSIS — Z6838 Body mass index (BMI) 38.0-38.9, adult: Secondary | ICD-10-CM

## 2023-03-02 MED ORDER — ROSUVASTATIN CALCIUM 5 MG PO TABS
5.0000 mg | ORAL_TABLET | Freq: Every day | ORAL | 1 refills | Status: DC
Start: 2023-03-02 — End: 2023-10-04

## 2023-03-02 NOTE — Assessment & Plan Note (Signed)
Well controlled.  No changes to medicines. Continue rosuvastatin 5 mg one before bed.  Continue to work on eating a healthy diet and exercise.  Labs drawn today.   

## 2023-03-02 NOTE — Patient Instructions (Signed)
Discuss with pharmacy about tdap and shingrix vaccine

## 2023-03-02 NOTE — Assessment & Plan Note (Signed)
Continue losartan and lasix. 

## 2023-03-02 NOTE — Assessment & Plan Note (Signed)
Well controlled.  No changes to medicines. Continue Atenolol 50 mg take 1 tablet twice daily, Losartan 25 mg take 1 tablet daily. Continue to work on eating a healthy diet and exercise.  Labs drawn today.   

## 2023-03-02 NOTE — Assessment & Plan Note (Signed)
Recommend continue to work on eating healthy diet and exercise.  

## 2023-03-03 DIAGNOSIS — Z6838 Body mass index (BMI) 38.0-38.9, adult: Secondary | ICD-10-CM | POA: Insufficient documentation

## 2023-03-03 LAB — CBC WITH DIFFERENTIAL/PLATELET
Basophils Absolute: 0.1 10*3/uL (ref 0.0–0.2)
Basos: 1 %
EOS (ABSOLUTE): 0.1 10*3/uL (ref 0.0–0.4)
Eos: 1 %
Hematocrit: 37.3 % (ref 34.0–46.6)
Hemoglobin: 12.2 g/dL (ref 11.1–15.9)
Immature Grans (Abs): 0 10*3/uL (ref 0.0–0.1)
Immature Granulocytes: 0 %
Lymphocytes Absolute: 1.8 10*3/uL (ref 0.7–3.1)
Lymphs: 28 %
MCH: 29.1 pg (ref 26.6–33.0)
MCHC: 32.7 g/dL (ref 31.5–35.7)
MCV: 89 fL (ref 79–97)
Monocytes Absolute: 0.5 10*3/uL (ref 0.1–0.9)
Monocytes: 7 %
Neutrophils Absolute: 4 10*3/uL (ref 1.4–7.0)
Neutrophils: 63 %
Platelets: 181 10*3/uL (ref 150–450)
RBC: 4.19 x10E6/uL (ref 3.77–5.28)
RDW: 12.6 % (ref 11.7–15.4)
WBC: 6.5 10*3/uL (ref 3.4–10.8)

## 2023-03-03 LAB — LIPID PANEL
Chol/HDL Ratio: 2 ratio (ref 0.0–4.4)
Cholesterol, Total: 128 mg/dL (ref 100–199)
HDL: 65 mg/dL (ref >39)
LDL Chol Calc (NIH): 51 mg/dL (ref 0–99)
Triglycerides: 54 mg/dL (ref 0–149)
VLDL Cholesterol Cal: 12 mg/dL (ref 5–40)

## 2023-03-03 LAB — COMPREHENSIVE METABOLIC PANEL
ALT: 9 IU/L (ref 0–32)
AST: 18 IU/L (ref 0–40)
Albumin: 4.2 g/dL (ref 3.8–4.8)
Alkaline Phosphatase: 100 IU/L (ref 44–121)
BUN/Creatinine Ratio: 21 (ref 12–28)
BUN: 26 mg/dL (ref 8–27)
Bilirubin Total: 0.5 mg/dL (ref 0.0–1.2)
CO2: 25 mmol/L (ref 20–29)
Calcium: 9.2 mg/dL (ref 8.7–10.3)
Chloride: 105 mmol/L (ref 96–106)
Creatinine, Ser: 1.26 mg/dL — ABNORMAL HIGH (ref 0.57–1.00)
Globulin, Total: 2.9 g/dL (ref 1.5–4.5)
Glucose: 113 mg/dL — ABNORMAL HIGH (ref 70–99)
Potassium: 4.7 mmol/L (ref 3.5–5.2)
Sodium: 145 mmol/L — ABNORMAL HIGH (ref 134–144)
Total Protein: 7.1 g/dL (ref 6.0–8.5)
eGFR: 44 mL/min/{1.73_m2} — ABNORMAL LOW (ref >59)

## 2023-03-03 LAB — HEMOGLOBIN A1C
Est. average glucose Bld gHb Est-mCnc: 120 mg/dL
Hgb A1c MFr Bld: 5.8 % — ABNORMAL HIGH (ref 4.8–5.6)

## 2023-03-03 LAB — LITHOLINK CKD PROGRAM

## 2023-03-03 NOTE — Assessment & Plan Note (Signed)
Recommend continue to work on eating healthy diet and exercise.  

## 2023-04-20 ENCOUNTER — Other Ambulatory Visit: Payer: Self-pay | Admitting: Family Medicine

## 2023-04-20 DIAGNOSIS — I5032 Chronic diastolic (congestive) heart failure: Secondary | ICD-10-CM

## 2023-05-16 DIAGNOSIS — N183 Chronic kidney disease, stage 3 unspecified: Secondary | ICD-10-CM | POA: Diagnosis not present

## 2023-05-23 DIAGNOSIS — D631 Anemia in chronic kidney disease: Secondary | ICD-10-CM | POA: Diagnosis not present

## 2023-05-23 DIAGNOSIS — I129 Hypertensive chronic kidney disease with stage 1 through stage 4 chronic kidney disease, or unspecified chronic kidney disease: Secondary | ICD-10-CM | POA: Diagnosis not present

## 2023-05-23 DIAGNOSIS — E876 Hypokalemia: Secondary | ICD-10-CM | POA: Diagnosis not present

## 2023-05-23 DIAGNOSIS — N1831 Chronic kidney disease, stage 3a: Secondary | ICD-10-CM | POA: Diagnosis not present

## 2023-05-29 DIAGNOSIS — K080 Exfoliation of teeth due to systemic causes: Secondary | ICD-10-CM | POA: Diagnosis not present

## 2023-06-21 DIAGNOSIS — K080 Exfoliation of teeth due to systemic causes: Secondary | ICD-10-CM | POA: Diagnosis not present

## 2023-07-04 ENCOUNTER — Other Ambulatory Visit: Payer: Self-pay | Admitting: Family Medicine

## 2023-07-16 ENCOUNTER — Other Ambulatory Visit: Payer: Self-pay | Admitting: Family Medicine

## 2023-07-16 DIAGNOSIS — I5032 Chronic diastolic (congestive) heart failure: Secondary | ICD-10-CM

## 2023-08-30 NOTE — Progress Notes (Signed)
Cancelled.  

## 2023-08-31 DIAGNOSIS — H5203 Hypermetropia, bilateral: Secondary | ICD-10-CM | POA: Diagnosis not present

## 2023-09-01 ENCOUNTER — Encounter (INDEPENDENT_AMBULATORY_CARE_PROVIDER_SITE_OTHER): Payer: Medicare Other | Admitting: Family Medicine

## 2023-09-01 DIAGNOSIS — E782 Mixed hyperlipidemia: Secondary | ICD-10-CM

## 2023-09-01 DIAGNOSIS — I129 Hypertensive chronic kidney disease with stage 1 through stage 4 chronic kidney disease, or unspecified chronic kidney disease: Secondary | ICD-10-CM

## 2023-09-01 DIAGNOSIS — R7301 Impaired fasting glucose: Secondary | ICD-10-CM

## 2023-09-01 DIAGNOSIS — N1832 Chronic kidney disease, stage 3b: Secondary | ICD-10-CM

## 2023-09-19 ENCOUNTER — Other Ambulatory Visit: Payer: Self-pay | Admitting: Family Medicine

## 2023-09-29 ENCOUNTER — Other Ambulatory Visit: Payer: Self-pay | Admitting: Family Medicine

## 2023-10-04 ENCOUNTER — Other Ambulatory Visit: Payer: Self-pay | Admitting: Family Medicine

## 2023-10-04 DIAGNOSIS — E782 Mixed hyperlipidemia: Secondary | ICD-10-CM

## 2023-10-11 ENCOUNTER — Other Ambulatory Visit: Payer: Self-pay | Admitting: Family Medicine

## 2023-10-11 DIAGNOSIS — I5032 Chronic diastolic (congestive) heart failure: Secondary | ICD-10-CM

## 2023-10-20 ENCOUNTER — Ambulatory Visit (INDEPENDENT_AMBULATORY_CARE_PROVIDER_SITE_OTHER): Payer: Medicare Other

## 2023-10-20 VITALS — BP 120/80 | HR 84 | Temp 97.4°F | Resp 14 | Ht 62.0 in | Wt 226.0 lb

## 2023-10-20 DIAGNOSIS — I5032 Chronic diastolic (congestive) heart failure: Secondary | ICD-10-CM | POA: Diagnosis not present

## 2023-10-20 DIAGNOSIS — R7301 Impaired fasting glucose: Secondary | ICD-10-CM | POA: Diagnosis not present

## 2023-10-20 DIAGNOSIS — E782 Mixed hyperlipidemia: Secondary | ICD-10-CM

## 2023-10-20 DIAGNOSIS — N1832 Chronic kidney disease, stage 3b: Secondary | ICD-10-CM

## 2023-10-20 DIAGNOSIS — I129 Hypertensive chronic kidney disease with stage 1 through stage 4 chronic kidney disease, or unspecified chronic kidney disease: Secondary | ICD-10-CM | POA: Diagnosis not present

## 2023-10-20 DIAGNOSIS — Z6841 Body Mass Index (BMI) 40.0 and over, adult: Secondary | ICD-10-CM | POA: Insufficient documentation

## 2023-10-20 DIAGNOSIS — E66813 Obesity, class 3: Secondary | ICD-10-CM | POA: Insufficient documentation

## 2023-10-20 NOTE — Assessment & Plan Note (Signed)
 Hyperlipidemia Managed with Crestor  5 mg daily. Cholesterol levels are well controlled. Emphasized maintaining cholesterol control for cardiovascular health. - Continue Crestor  5 mg daily

## 2023-10-20 NOTE — Assessment & Plan Note (Addendum)
 Managed with atenolol  25 mg BID and losartan  25 mg daily. Blood pressure is well controlled. Emphasized maintaining blood pressure control to prevent complications. - Continue atenolol  25 mg BID - Continue losartan  25 mg daily   Slightly reduced renal function, stable without rapid decline. Emphasized avoiding nephrotoxic medications and maintaining blood pressure control. Discussed avoiding NSAIDs, healthy diet, and regular physical activity. - Avoid NSAIDs (ibuprofen, naproxen) - Maintain blood pressure control

## 2023-10-20 NOTE — Progress Notes (Signed)
 Subjective:  Patient ID: Lisa Ortega, female    DOB: [birth year]  Age: 78 y.o. MRN: [id number]  Chief Complaint  Patient presents with   Medical Management of Chronic Issues    HPI   Lisa Ortega is a 78 year old female with hypertension, chronic kidney disease stage three, and heart failure who presents for routine follow-up. She is accompanied by her daughter who lives with her.  She takes atenolol  25 mg twice daily and losartan  25 mg once daily for hypertension. Her blood pressure medications were adjusted previously to the current regimen, which has been effective in controlling her blood pressure.  She has heart failure with preserved ejection fraction, characterized by good pumping function but impaired relaxation of the heart. She experiences mild shortness of breath with extended walking but has no significant leg swelling. She has prominent veins, which she attributes to difficulty using compression stockings due to arthritic deformities in her fingers.  She has chronic kidney disease stage three with stable kidney function over time. She avoids NSAIDs to manage her condition.  She lives with her daughter and has difficulty maintaining a healthy diet and exercise routine. She does not eat healthily or engage in enough physical activity. She denies any current pain or discomfort and reports regular bowel movements. Hyperlipidemia:  Patient is currently taking Rosuvastatin  5 mg take 1 tablet daily.    Hypertension with CKD: Patient is currently taking Atenolol  50 mg take 1 tablet twice daily, Losartan  25 mg take 1 tablet daily, Lasix  20 mg daily   Hypokalemia: Taking potassium chloride 20 meq 1/2 daily. Patient sees  Dr. Katheleen, nephrologist at [facility] annually.    Prediabetes: Last A1c: 5.8 Does not eat healthy or exercise.    Memory loss: Taking B12 lozenges.       10/20/2023   10:22 AM 12/20/2022    1:35 PM 08/31/2022    9:52 AM 12/14/2021    2:13 PM  05/26/2021    9:17 AM  Depression screen PHQ 2/9  Decreased Interest 0 0 0 0 0  Down, Depressed, Hopeless 0 0 0 0 0  PHQ - 2 Score 0 0 0 0 0  Altered sleeping 0      Tired, decreased energy 0      Change in appetite 0      Feeling bad or failure about yourself  0      Trouble concentrating 0      Moving slowly or fidgety/restless 0      Suicidal thoughts 0      PHQ-9 Score 0      Difficult doing work/chores Not difficult at all            10/20/2023   10:22 AM  Fall Risk   Falls in the past year? 0  Number falls in past yr: 0  Injury with Fall? 0  Risk for fall due to : No Fall Risks  Follow up Falls evaluation completed    Patient Care Team: Sherre Clapper, MD as PCP - General (Family Medicine) Dolan Mateo Larger, MD as Consulting Physician (Nephrology)   Review of Systems  Constitutional:  Negative for chills, fatigue and fever.  HENT:  Negative for congestion, ear pain and sore throat.   Respiratory:  Negative for cough and shortness of breath.   Cardiovascular:  Negative for chest pain and palpitations.  Gastrointestinal:  Negative for abdominal pain, constipation, diarrhea, nausea and vomiting.  Endocrine: Negative for polydipsia, polyphagia and polyuria.  Genitourinary:  Negative for difficulty urinating and dysuria.  Musculoskeletal:  Negative for arthralgias, back pain and myalgias.  Skin:  Negative for rash.  Neurological:  Negative for headaches.  Psychiatric/Behavioral:  Negative for dysphoric mood. The patient is not nervous/anxious.     Current Outpatient Medications on File Prior to Visit  Medication Sig Dispense Refill   atenolol  (TENORMIN ) 50 MG tablet Take 1 tablet by mouth twice daily 180 tablet 0   Cholecalciferol (VITAMIN D3) 50 MCG (2000 UT) capsule Take 2,000 Units by mouth daily.     Cyanocobalamin  (VITAMIN B 12) 250 MCG LOZG Take by mouth.     furosemide  (LASIX ) 20 MG tablet Take 1 tablet by mouth once daily 90 tablet 0   KLOR-CON M20 20 MEQ  tablet Take 20 mEq by mouth daily.     losartan  (COZAAR ) 25 MG tablet Take 1 tablet by mouth once daily 90 tablet 0   rosuvastatin  (CRESTOR ) 5 MG tablet Take 1 tablet by mouth once daily 90 tablet 0   No current facility-administered medications on file prior to visit.   Past Medical History:  Diagnosis Date   Acute on chronic diastolic (congestive) heart failure (HCC)    Essential (primary) hypertension    Essential hypertension, benign 11/21/2019   Hypertensive chronic kidney disease with stage 1 through stage 4 chronic kidney disease, or unspecified chronic kidney disease    Impaired fasting glucose    Localized edema    Memory loss 03/03/2021   Mixed hyperlipidemia    Morbid (severe) obesity due to excess calories (HCC)    Obesity, Class III, BMI 40-49.9 (morbid obesity) (HCC) 11/23/2019   Other seasonal allergic rhinitis    Other specified nonscarring hair loss    Pain in left knee 01/21/2020   Telogen effluvium    Past Surgical History:  Procedure Laterality Date   CHOLECYSTECTOMY  04/01/2022   Dr Margert   TONSILLECTOMY      Family History  Problem Relation Age of Onset   Transient ischemic attack Mother    Alzheimer's disease Mother    Stroke Father    Throat cancer Father    Breast cancer Sister    Pulmonary embolism Brother    Asthma Other    Diabetes type II Other    Hypertension Other    Hypothyroidism Other    Heart attack Other    Migraines Other    Osteoarthritis Other    Breast cancer Niece    Social History   Socioeconomic History   Marital status: Divorced    Spouse name: Not on file   Number of children: 3   Years of education: Not on file   Highest education level: Not on file  Occupational History   Occupation: Sports Coach  Tobacco Use   Smoking status: Never   Smokeless tobacco: Never  Vaping Use   Vaping status: Never Used  Substance and Sexual Activity   Alcohol use: Never   Drug use: Never   Sexual activity: Not Currently     Birth control/protection: Post-menopausal  Other Topics Concern   Not on file  Social History Narrative   3 daughters, 5 grandchildren, 5 great-grandchildren   Right handed   One story home, couple of steps at door   Drinks caffeine   Social Drivers of Health   Financial Resource Strain: Low Risk  (12/20/2022)   Overall Financial Resource Strain (CARDIA)    Difficulty of Paying Living Expenses: Not very hard  Food Insecurity: No Food  Insecurity (12/20/2022)   Hunger Vital Sign    Worried About Running Out of Food in the Last Year: Never true    Ran Out of Food in the Last Year: Never true  Transportation Needs: No Transportation Needs (12/20/2022)   PRAPARE - Administrator, Civil Service (Medical): No    Lack of Transportation (Non-Medical): No  Physical Activity: Insufficiently Active (12/20/2022)   Exercise Vital Sign    Days of Exercise per Week: 2 days    Minutes of Exercise per Session: 20 min  Stress: No Stress Concern Present (12/20/2022)   [employer] of Occupational Health - Occupational Stress Questionnaire    Feeling of Stress : Not at all  Social Connections: Socially Isolated (03/02/2023)   Social Connection and Isolation Panel [NHANES]    Frequency of Communication with Friends and Family: Three times a week    Frequency of Social Gatherings with Friends and Family: Three times a week    Attends Religious Services: Never    Active Member of Clubs or Organizations: No    Attends Banker Meetings: Never    Marital Status: Divorced    Objective:  BP 120/80   Pulse 84   Temp (!) 97.4 F (36.3 C)   Resp 14   Ht 5' 2 (1.575 m)   Wt 226 lb (102.5 kg)   SpO2 100%   BMI 41.34 kg/m      10/20/2023   10:12 AM 03/02/2023    9:13 AM 08/31/2022    9:47 AM  BP/Weight  Systolic BP 120 136 138  Diastolic BP 80 78 62  Wt. (Lbs) 226 213 207  BMI 41.34 kg/m2 38.96 kg/m2 37.86 kg/m2    Physical Exam Vitals and nursing note reviewed.   Constitutional:      Appearance: She is obese.  HENT:     Head: Normocephalic and atraumatic.  Cardiovascular:     Rate and Rhythm: Normal rate and regular rhythm.  Pulmonary:     Effort: Pulmonary effort is normal.     Breath sounds: Normal breath sounds.  Musculoskeletal:        General: Swelling (trace pedal edema bilaterally, prominent veins noted both legs) present.     Cervical back: Normal range of motion.  Skin:    General: Skin is warm.  Neurological:     General: No focal deficit present.     Mental Status: She is alert.  Psychiatric:        Mood and Affect: Mood normal.     Diabetic Foot Exam - Simple   No data filed      Lab Results  Component Value Date   WBC 6.5 03/02/2023   HGB 12.2 03/02/2023   HCT 37.3 03/02/2023   PLT 181 03/02/2023   GLUCOSE 113 (H) 03/02/2023   CHOL 128 03/02/2023   TRIG 54 03/02/2023   HDL 65 03/02/2023   LDLCALC 51 03/02/2023   ALT 9 03/02/2023   AST 18 03/02/2023   NA 145 (H) 03/02/2023   K 4.7 03/02/2023   CL 105 03/02/2023   CREATININE 1.26 (H) 03/02/2023   BUN 26 03/02/2023   CO2 25 03/02/2023   TSH 3.290 05/26/2022   HGBA1C 5.8 (H) 03/02/2023      Assessment & Plan:    Hypertensive kidney disease with stage 3b chronic kidney disease (HCC) Assessment & Plan: Managed with atenolol  25 mg BID and losartan  25 mg daily. Blood pressure is well controlled. Emphasized maintaining blood  pressure control to prevent complications. - Continue atenolol  25 mg BID - Continue losartan  25 mg daily   Slightly reduced renal function, stable without rapid decline. Emphasized avoiding nephrotoxic medications and maintaining blood pressure control. Discussed avoiding NSAIDs, healthy diet, and regular physical activity. - Avoid NSAIDs (ibuprofen, naproxen) - Maintain blood pressure control    Orders: -     CBC with Differential/Platelet -     Comprehensive metabolic panel  Mixed hyperlipidemia Assessment &  Plan: Hyperlipidemia Managed with Crestor  5 mg daily. Cholesterol levels are well controlled. Emphasized maintaining cholesterol control for cardiovascular health. - Continue Crestor  5 mg daily  Orders: -     Lipid panel  Impaired fasting glucose Assessment & Plan:   Borderline Diabetes Previous blood work showed borderline elevated blood glucose levels. No formal diabetes diagnosis. Discussed monitoring blood glucose and maintaining a healthy lifestyle to prevent progression. - Check blood glucose levels in current blood work  General Health Maintenance Advised on lifestyle modifications including diet, exercise, and weight management. Discussed reducing sodium intake, increasing physical activity, and using a sock aid for compression stockings if needed. - Encourage healthy eating and smaller portions - Reduce sodium intake - Increase physical activity, including walking and home exercises - Elevate legs when sitting or lying down - Consider using a sock aid for compression stockings  Follow-up - Order blood work - Schedule follow-up appointment in 6 months, or sooner if blood work indicates or symptoms worsen.  Orders: -     Hemoglobin A1c  Chronic diastolic congestive heart failure [facility]) Assessment & Plan: Echo in July 2022 showed good systolic function but impaired diastolic relaxation. Symptoms include occasional dyspnea and peripheral edema. Managed with current medications. Discussed symptom monitoring and lifestyle modifications. - Monitor for worsening symptoms such as increased dyspnea or peripheral edema - Consider repeating echocardiogram if symptoms worsen Recommend sodium restriction, weight monitoring etc   Class 3 severe obesity due to excess calories with serious comorbidity and body mass index (BMI) of 40.0 to 44.9 in adult [facility]) Assessment & Plan: Strongly recommend healthier calorie intake, portion control and weight management Advised to move around  more to improve strength and lose weight      No orders of the defined types were placed in this encounter.   Orders Placed This Encounter  Procedures   CBC with Differential/Platelet   Comprehensive metabolic panel   Hemoglobin A1c   Lipid panel     Follow-up: Return if symptoms worsen or fail to improve.     An After Visit Summary was printed and given to the patient.  Geraline Halberstadt, MD [facility] [phone]

## 2023-10-20 NOTE — Assessment & Plan Note (Signed)
 Strongly recommend healthier calorie intake, portion control and weight management Advised to move around more to improve strength and lose weight

## 2023-10-20 NOTE — Patient Instructions (Signed)
 VISIT SUMMARY:  Today, we reviewed your ongoing health conditions, including heart failure, hypertension, chronic kidney disease, and hyperlipidemia. We discussed your current medications, lifestyle modifications, and the importance of monitoring your symptoms. Your daughter was present during the visit.  YOUR PLAN:  -HEART FAILURE WITH PRESERVED EJECTION FRACTION (HFPEF): Heart failure with preserved ejection fraction means your heart pumps well but has trouble relaxing. Continue your current medications and monitor for any worsening symptoms like increased shortness of breath or swelling. If symptoms worsen, we may need to repeat your heart ultrasound.  -HYPERTENSION: Hypertension is high blood pressure. Your blood pressure is well controlled with your current medications, atenolol  25 mg twice daily and losartan  25 mg once daily. Continue taking these medications as prescribed.  -CHRONIC KIDNEY DISEASE STAGE 3: Chronic kidney disease stage 3 means your kidneys have reduced function but are stable. Avoid medications that can harm your kidneys, like NSAIDs (ibuprofen, naproxen), and maintain good blood pressure control.  -HYPERLIPIDEMIA: Hyperlipidemia is high cholesterol. Your cholesterol levels are well controlled with Crestor  5 mg daily. Continue taking this medication to maintain your cholesterol levels.  -BORDERLINE DIABETES: Borderline diabetes means your blood sugar levels are higher than normal but not high enough to be diagnosed as diabetes. We will check your blood glucose levels in your current blood work and focus on maintaining a healthy lifestyle to prevent progression.  -GENERAL HEALTH MAINTENANCE: We discussed the importance of a healthy lifestyle, including eating smaller portions, reducing sodium intake, increasing physical activity, and elevating your legs when sitting or lying down. Consider using a sock aid for your compression stockings if needed.  INSTRUCTIONS:  Please  complete the ordered blood work and schedule a follow-up appointment in 6 months, or sooner if your blood work indicates any issues or if your symptoms worsen.

## 2023-10-20 NOTE — Assessment & Plan Note (Signed)
   Borderline Diabetes Previous blood work showed borderline elevated blood glucose levels. No formal diabetes diagnosis. Discussed monitoring blood glucose and maintaining a healthy lifestyle to prevent progression. - Check blood glucose levels in current blood work  General Health Maintenance Advised on lifestyle modifications including diet, exercise, and weight management. Discussed reducing sodium intake, increasing physical activity, and using a sock aid for compression stockings if needed. - Encourage healthy eating and smaller portions - Reduce sodium intake - Increase physical activity, including walking and home exercises - Elevate legs when sitting or lying down - Consider using a sock aid for compression stockings  Follow-up - Order blood work - Schedule follow-up appointment in 6 months, or sooner if blood work indicates or symptoms worsen.

## 2023-10-20 NOTE — Assessment & Plan Note (Addendum)
 Echo in July 2022 showed good systolic function but impaired diastolic relaxation. Symptoms include occasional dyspnea and peripheral edema. Managed with current medications. Discussed symptom monitoring and lifestyle modifications. - Monitor for worsening symptoms such as increased dyspnea or peripheral edema - Consider repeating echocardiogram if symptoms worsen Recommend sodium restriction, weight monitoring etc

## 2023-10-21 LAB — CBC WITH DIFFERENTIAL/PLATELET
Basophils Absolute: 0.1 10*3/uL (ref 0.0–0.2)
Basos: 1 %
EOS (ABSOLUTE): 0.1 10*3/uL (ref 0.0–0.4)
Eos: 1 %
Hematocrit: 37.6 % (ref 34.0–46.6)
Hemoglobin: 12.5 g/dL (ref 11.1–15.9)
Immature Grans (Abs): 0 10*3/uL (ref 0.0–0.1)
Immature Granulocytes: 0 %
Lymphocytes Absolute: 1.6 10*3/uL (ref 0.7–3.1)
Lymphs: 24 %
MCH: 29.4 pg (ref 26.6–33.0)
MCHC: 33.2 g/dL (ref 31.5–35.7)
MCV: 89 fL (ref 79–97)
Monocytes Absolute: 0.5 10*3/uL (ref 0.1–0.9)
Monocytes: 7 %
Neutrophils Absolute: 4.7 10*3/uL (ref 1.4–7.0)
Neutrophils: 67 %
Platelets: 188 10*3/uL (ref 150–450)
RBC: 4.25 x10E6/uL (ref 3.77–5.28)
RDW: 11.9 % (ref 11.7–15.4)
WBC: 6.9 10*3/uL (ref 3.4–10.8)

## 2023-10-21 LAB — COMPREHENSIVE METABOLIC PANEL
ALT: 9 [IU]/L (ref 0–32)
AST: 20 [IU]/L (ref 0–40)
Albumin: 4.2 g/dL (ref 3.8–4.8)
Alkaline Phosphatase: 98 [IU]/L (ref 44–121)
BUN/Creatinine Ratio: 17 (ref 12–28)
BUN: 21 mg/dL (ref 8–27)
Bilirubin Total: 0.5 mg/dL (ref 0.0–1.2)
CO2: 26 mmol/L (ref 20–29)
Calcium: 9.1 mg/dL (ref 8.7–10.3)
Chloride: 105 mmol/L (ref 96–106)
Creatinine, Ser: 1.24 mg/dL — ABNORMAL HIGH (ref 0.57–1.00)
Globulin, Total: 3 g/dL (ref 1.5–4.5)
Glucose: 113 mg/dL — ABNORMAL HIGH (ref 70–99)
Potassium: 4.8 mmol/L (ref 3.5–5.2)
Sodium: 145 mmol/L — ABNORMAL HIGH (ref 134–144)
Total Protein: 7.2 g/dL (ref 6.0–8.5)
eGFR: 45 mL/min/{1.73_m2} — ABNORMAL LOW (ref >59)

## 2023-10-21 LAB — HEMOGLOBIN A1C
Est. average glucose Bld gHb Est-mCnc: 120 mg/dL
Hgb A1c MFr Bld: 5.8 % — ABNORMAL HIGH (ref 4.8–5.6)

## 2023-10-21 LAB — LIPID PANEL
Chol/HDL Ratio: 2.1 {ratio} (ref 0.0–4.4)
Cholesterol, Total: 131 mg/dL (ref 100–199)
HDL: 61 mg/dL (ref >39)
LDL Chol Calc (NIH): 56 mg/dL (ref 0–99)
Triglycerides: 70 mg/dL (ref 0–149)
VLDL Cholesterol Cal: 14 mg/dL (ref 5–40)

## 2023-12-05 DIAGNOSIS — K080 Exfoliation of teeth due to systemic causes: Secondary | ICD-10-CM | POA: Diagnosis not present

## 2024-01-05 ENCOUNTER — Other Ambulatory Visit: Payer: Self-pay | Admitting: Family Medicine

## 2024-01-05 DIAGNOSIS — E782 Mixed hyperlipidemia: Secondary | ICD-10-CM

## 2024-01-05 MED ORDER — ROSUVASTATIN CALCIUM 5 MG PO TABS: 5.0000 mg | ORAL_TABLET | Freq: Every day | ORAL | 0 refills | Status: DC

## 2024-01-05 NOTE — Telephone Encounter (Signed)
 Copied from CRM [id number]. Topic: Clinical - Medication Refill >> Jan 05, 2024  9:04 AM Antwanette L wrote: Most Recent Primary Care Visit:  Provider: Loistine Rinne  Department: [facility]  Visit Type: OFFICE VISIT  Date: 10/20/2023  Medication: rosuvastatin  (CRESTOR ) 5 MG tablet  Has the patient contacted their pharmacy? No   Is this the correct pharmacy for this prescription? Yes  [id number], [place] - [address] [name] [place] [address] Phone: [phone] Fax: [phone]   Has the prescription been filled recently? No. Last refilled on 10/04/23  Is the patient out of the medication? Yes  Has the patient been seen for an appointment in the last year OR does the patient have an upcoming appointment? Yes  Can we respond through MyChart? No. Contact patient by phone at [phone]  Agent: Please be advised that Rx refills may take up to 3 business days. We ask that you follow-up with your pharmacy.

## 2024-01-08 ENCOUNTER — Other Ambulatory Visit: Payer: Self-pay | Admitting: Family Medicine

## 2024-01-08 DIAGNOSIS — I5032 Chronic diastolic (congestive) heart failure: Secondary | ICD-10-CM

## 2024-01-18 ENCOUNTER — Encounter (HOSPITAL_COMMUNITY): Payer: Self-pay

## 2024-01-25 ENCOUNTER — Ambulatory Visit (INDEPENDENT_AMBULATORY_CARE_PROVIDER_SITE_OTHER)

## 2024-01-25 VITALS — Ht 62.0 in | Wt 226.0 lb

## 2024-01-25 DIAGNOSIS — Z0000 Encounter for general adult medical examination without abnormal findings: Secondary | ICD-10-CM | POA: Diagnosis not present

## 2024-01-25 DIAGNOSIS — Z1231 Encounter for screening mammogram for malignant neoplasm of breast: Secondary | ICD-10-CM

## 2024-01-25 DIAGNOSIS — Z1211 Encounter for screening for malignant neoplasm of colon: Secondary | ICD-10-CM

## 2024-01-25 NOTE — Progress Notes (Signed)
 Subjective:   Lisa Ortega is a 78 y.o. who presents for a Medicare Wellness preventive visit.  As a reminder, Annual Wellness Visits don't include a physical exam, and some assessments may be limited, especially if this visit is performed virtually. We may recommend an in-person visit if needed.  Visit Complete: Virtual I connected with  Stewart Elk on 01/25/24 by a audio enabled telemedicine application and verified that I am speaking with the correct person using two identifiers.  Patient Location: Home  Provider Location: Home Office  I discussed the limitations of evaluation and management by telemedicine. The patient expressed understanding and agreed to proceed.  Vital Signs: Because this visit was a virtual/telehealth visit, some criteria may be missing or patient reported. Any vitals not documented were not able to be obtained and vitals that have been documented are patient reported.  VideoDeclined- This patient declined Librarian, academic. Therefore the visit was completed with audio only.  Persons Participating in Visit: Patient.  AWV Questionnaire: No: Patient Medicare AWV questionnaire was not completed prior to this visit.  Cardiac Risk Factors include: advanced age (>47men, >49 women);dyslipidemia;hypertension     Objective:     Today's Vitals   01/25/24 1401  Weight: 226 lb (102.5 kg)  Height: 5\' 2"  (1.575 m)   Body mass index is 41.34 kg/m.     01/25/2024    2:14 PM 02/01/2022   11:52 PM  Advanced Directives  Does Patient Have a Medical Advance Directive? No No  Would patient like information on creating a medical advance directive? Yes (MAU/Ambulatory/Procedural Areas - Information given)     Current Medications (verified) Outpatient Encounter Medications as of 01/25/2024  Medication Sig   atenolol  (TENORMIN ) 50 MG tablet Take 1 tablet by mouth twice daily   Cholecalciferol (VITAMIN D3) 50 MCG (2000 UT) capsule  Take 2,000 Units by mouth daily.   Cyanocobalamin  (VITAMIN B 12) 250 MCG LOZG Take by mouth.   furosemide  (LASIX ) 20 MG tablet Take 1 tablet by mouth once daily   KLOR-CON M20 20 MEQ tablet Take 20 mEq by mouth daily.   losartan  (COZAAR ) 25 MG tablet Take 1 tablet by mouth once daily   rosuvastatin  (CRESTOR ) 5 MG tablet Take 1 tablet (5 mg total) by mouth daily.   No facility-administered encounter medications on file as of 01/25/2024.    Allergies (verified) Amlodipine and Lisinopril-hydrochlorothiazide   History: Past Medical History:  Diagnosis Date   Acute on chronic diastolic (congestive) heart failure (HCC)    Essential (primary) hypertension    Essential hypertension, benign 11/21/2019   Hypertensive chronic kidney disease with stage 1 through stage 4 chronic kidney disease, or unspecified chronic kidney disease    Impaired fasting glucose    Localized edema    Memory loss 03/03/2021   Mixed hyperlipidemia    Morbid (severe) obesity due to excess calories (HCC)    Obesity, Class III, BMI 40-49.9 (morbid obesity) 11/23/2019   Other seasonal allergic rhinitis    Other specified nonscarring hair loss    Pain in left knee 01/21/2020   Telogen effluvium    Past Surgical History:  Procedure Laterality Date   CHOLECYSTECTOMY  04/01/2022   Dr Fonda Hymen   TONSILLECTOMY     Family History  Problem Relation Age of Onset   Transient ischemic attack Mother    Alzheimer's disease Mother    Stroke Father    Throat cancer Father    Breast cancer Sister  Pulmonary embolism Brother    Asthma Other    Diabetes type II Other    Hypertension Other    Hypothyroidism Other    Heart attack Other    Migraines Other    Osteoarthritis Other    Breast cancer Niece    Social History   Socioeconomic History   Marital status: Divorced    Spouse name: Not on file   Number of children: 3   Years of education: Not on file   Highest education level: Not on file  Occupational History    Occupation: Sports coach  Tobacco Use   Smoking status: Never   Smokeless tobacco: Never  Vaping Use   Vaping status: Never Used  Substance and Sexual Activity   Alcohol use: Never   Drug use: Never   Sexual activity: Not Currently    Birth control/protection: Post-menopausal  Other Topics Concern   Not on file  Social History Narrative   3 daughters, 5 grandchildren, 5 great-grandchildren   Right handed   One story home, couple of steps at door   Drinks caffeine   Social Drivers of Health   Financial Resource Strain: Low Risk  (01/25/2024)   Overall Financial Resource Strain (CARDIA)    Difficulty of Paying Living Expenses: Not hard at all  Food Insecurity: No Food Insecurity (01/25/2024)   Hunger Vital Sign    Worried About Running Out of Food in the Last Year: Never true    Ran Out of Food in the Last Year: Never true  Transportation Needs: No Transportation Needs (01/25/2024)   PRAPARE - Administrator, Civil Service (Medical): No    Lack of Transportation (Non-Medical): No  Physical Activity: Insufficiently Active (01/25/2024)   Exercise Vital Sign    Days of Exercise per Week: 3 days    Minutes of Exercise per Session: 30 min  Stress: No Stress Concern Present (01/25/2024)   [employer] of Occupational Health - Occupational Stress Questionnaire    Feeling of Stress : Not at all  Social Connections: Socially Isolated (01/25/2024)   Social Connection and Isolation Panel [NHANES]    Frequency of Communication with Friends and Family: Three times a week    Frequency of Social Gatherings with Friends and Family: Three times a week    Attends Religious Services: Never    Active Member of Clubs or Organizations: No    Attends Banker Meetings: Never    Marital Status: Divorced    Tobacco Counseling Counseling given: Not Answered    Clinical Intake:  Pre-visit preparation completed: Yes  Pain : No/denies pain     Diabetes:  No  Lab Results  Component Value Date   HGBA1C 5.8 (H) 10/20/2023   HGBA1C 5.8 (H) 03/02/2023   HGBA1C 6.0 (H) 08/31/2022     How often do you need to have someone help you when you read instructions, pamphlets, or other written materials from your doctor or pharmacy?: 1 - Never  Interpreter Needed?: No  Information entered by :: Seabron Cypress LPN   Activities of Daily Living     01/25/2024    2:14 PM  In your present state of health, do you have any difficulty performing the following activities:  Hearing? 0  Vision? 0  Difficulty concentrating or making decisions? 0  Walking or climbing stairs? 0  Dressing or bathing? 0  Doing errands, shopping? 0  Preparing Food and eating ? N  Using the Toilet? N  In the past  six months, have you accidently leaked urine? N  Do you have problems with loss of bowel control? N  Managing your Medications? N  Managing your Finances? N  Housekeeping or managing your Housekeeping? N    Patient Care Team: Mercy Stall, MD as PCP - General (Family Medicine) Clementine Cutting, MD as Consulting Physician (Nephrology) Murtis Arthur, OD (Optometry)  Indicate any recent Medical Services you may have received from other than [facility] providers in the past year (date may be approximate).     Assessment:    This is a routine wellness examination for Aylanie.  Hearing/Vision screen Hearing Screening - Comments:: Denies hearing difficulties   Vision Screening - Comments:: Wears rx glasses - up to date with routine eye exams with Dr. Garland Junk    Goals Addressed             This Visit's Progress    Remain active and independent   On track      Depression Screen     01/25/2024    2:13 PM 10/20/2023   10:22 AM 12/20/2022    1:35 PM 08/31/2022    9:52 AM 12/14/2021    2:13 PM 05/26/2021    9:17 AM 11/23/2020    9:03 AM  PHQ 2/9 Scores  PHQ - 2 Score 0 0 0 0 0 0 0  PHQ- 9 Score  0         Fall Risk     01/25/2024    2:13 PM 10/20/2023    10:22 AM 03/02/2023    9:15 AM 12/20/2022    1:37 PM 08/31/2022    9:51 AM  Fall Risk   Falls in the past year? 0 0 0 0 0  Number falls in past yr: 0 0 0 0 0  Injury with Fall? 0 0 0 0 0  Risk for fall due to : No Fall Risks No Fall Risks No Fall Risks;Impaired balance/gait No Fall Risks No Fall Risks  Follow up Falls prevention discussed;Education provided;Falls evaluation completed Falls evaluation completed Falls evaluation completed Falls evaluation completed;Education provided Falls evaluation completed    MEDICARE RISK AT HOME:  Medicare Risk at Home Any stairs in or around the home?: No If so, are there any without handrails?: No Home free of loose throw rugs in walkways, pet beds, electrical cords, etc?: Yes Adequate lighting in your home to reduce risk of falls?: Yes Life alert?: No Use of a cane, walker or w/c?: No Grab bars in the bathroom?: Yes Shower chair or bench in shower?: No Elevated toilet seat or a handicapped toilet?: Yes  TIMED UP AND GO:  Was the test performed?  No  Cognitive Function: 6CIT completed      03/03/2021    2:00 PM  Montreal Cognitive Assessment   Visuospatial/ Executive (0/5) 3  Naming (0/3) 3  Attention: Read list of digits (0/2) 2  Attention: Read list of letters (0/1) 1  Attention: Serial 7 subtraction starting at 100 (0/3) 3  Language: Repeat phrase (0/2) 2  Language : Fluency (0/1) 0  Abstraction (0/2) 2  Delayed Recall (0/5) 5  Orientation (0/6) 6  Total 27  Adjusted Score (based on education) 28      01/25/2024    2:14 PM 12/20/2022    1:38 PM 12/14/2021    2:29 PM  6CIT Screen  What Year? 0 points 0 points 0 points  What month? 0 points 0 points 0 points  What time? 0 points 0  points 0 points  Count back from 20 0 points 0 points 2 points  Months in reverse 2 points 2 points 4 points  Repeat phrase 0 points 2 points 4 points  Total Score 2 points 4 points 10 points    Immunizations Immunization History  Administered  Date(s) Administered   Fluad Quad(high Dose 65+) 05/23/2019   Influenza,inj,quad, With Preservative 09/18/2019   Pneumococcal Conjugate-13 03/25/2015   Pneumococcal Polysaccharide-23 05/25/2016   Zoster, Live 03/31/2015    Screening Tests Health Maintenance  Topic Date Due   DTaP/Tdap/Td (1 - Tdap) Never done   Zoster Vaccines- Shingrix (1 of 2) [birth year]27/1965   MAMMOGRAM  01/03/2024   INFLUENZA VACCINE  04/12/2024   Medicare Annual Wellness (AWV)  01/24/2025   Pneumonia Vaccine 35+ Years old  Completed   DEXA SCAN  Completed   Hepatitis C Screening  Completed   HPV VACCINES  Aged Out   Meningococcal B Vaccine  Aged Out   COVID-19 Vaccine  Discontinued   Fecal DNA (Cologuard)  Discontinued    Health Maintenance  Health Maintenance Due  Topic Date Due   DTaP/Tdap/Td (1 - Tdap) Never done   Zoster Vaccines- Shingrix (1 of 2) 03[birth year]27/1965   MAMMOGRAM  01/03/2024   Health Maintenance Items Addressed: Mammogram ordered, Cologuard Ordered  Additional Screening:  Vision Screening: Recommended annual ophthalmology exams for early detection of glaucoma and other disorders of the eye.  Dental Screening: Recommended annual dental exams for proper oral hygiene  Community Resource Referral / Chronic Care Management: CRR required this visit?  No   CCM required this visit?  No   Plan:    I have personally reviewed and noted the following in the patient's chart:   Medical and social history Use of alcohol, tobacco or illicit drugs  Current medications and supplements including opioid prescriptions. Patient is not currently taking opioid prescriptions. Functional ability and status Nutritional status Physical activity Advanced directives List of other physicians Hospitalizations, surgeries, and ER visits in previous 12 months Vitals Screenings to include cognitive, depression, and falls Referrals and appointments  In addition, I have reviewed and discussed with patient  certain preventive protocols, quality metrics, and best practice recommendations. A written personalized care plan for preventive services as well as general preventive health recommendations were provided to patient.   Seabron Cypress [place], [place]   [id number]   After Visit Summary: (MyChart) Due to this being a telephonic visit, the after visit summary with patients personalized plan was offered to patient via MyChart   Notes: Nothing significant to report at this time.

## 2024-01-25 NOTE — Patient Instructions (Signed)
 Lisa Ortega , Thank you for taking time out of your busy schedule to complete your Annual Wellness Visit with me. I enjoyed our conversation and look forward to speaking with you again next year. I, as well as your care team,  appreciate your ongoing commitment to your health goals. Please review the following plan we discussed and let me know if I can assist you in the future. Your Game plan/ To Do List    Referrals:  You have an order for:  []   2D Mammogram  [x]   3D Mammogram  []   Bone Density     Please call for appointment:   [name] [address] [place], [place] [address] [phone]   Make sure to wear two-piece clothing.  No lotions, powders, or deodorants the day of the appointment. Make sure to bring picture ID and insurance card.  Bring list of medications you are currently taking including any supplements.   Follow up Visits: Next Medicare AWV with our clinical staff: In 1 year   Have you seen your provider in the last 6 months (3 months if uncontrolled diabetes)? Yes Next Office Visit with your provider: Scheduled for 04/19/24 @ 9:20  Clinician Recommendations:  Aim for 30 minutes of exercise or brisk walking, 6-8 glasses of water, and 5 servings of fruits and vegetables each day.       This is a list of the screening recommended for you and due dates:  Health Maintenance  Topic Date Due   DTaP/Tdap/Td vaccine (1 - Tdap) Never done   Zoster (Shingles) Vaccine (1 of 2) 03/27/1964   Mammogram  01/03/2024   Flu Shot  04/12/2024   Medicare Annual Wellness Visit  01/24/2025   Pneumonia Vaccine  Completed   DEXA scan (bone density measurement)  Completed   Hepatitis C Screening  Completed   HPV Vaccine  Aged Out   Meningitis B Vaccine  Aged Out   COVID-19 Vaccine  Discontinued   Cologuard (Stool DNA test)  Discontinued    Advanced directives: (ACP Link)Information on Advanced Care Planning can be found at [employer] of  [facility] Advance Health Care Directives Advance Health Care Directives. [email or web address]  Advance Care Planning is important because it:  [x]  Makes sure you receive the medical care that is consistent with your values, goals, and preferences  [x]  It provides guidance to your family and loved ones and reduces their decisional burden about whether or not they are making the right decisions based on your wishes.  Follow the link provided in your after visit summary or read over the paperwork we have mailed to you to help you started getting your Advance Directives in place. If you need assistance in completing these, please reach out to us  so that we can help you!  See attachments for Preventive Care and Fall Prevention Tips.

## 2024-02-12 ENCOUNTER — Other Ambulatory Visit: Payer: Self-pay | Admitting: Family Medicine

## 2024-02-19 ENCOUNTER — Ambulatory Visit: Payer: Self-pay | Admitting: Family Medicine

## 2024-02-19 LAB — COLOGUARD

## 2024-02-27 DIAGNOSIS — Z1211 Encounter for screening for malignant neoplasm of colon: Secondary | ICD-10-CM | POA: Diagnosis not present

## 2024-03-01 ENCOUNTER — Ambulatory Visit
Admission: RE | Admit: 2024-03-01 | Discharge: 2024-03-01 | Disposition: A | Discharge status: Home / Self Care | Source: Ambulatory Visit | Attending: Family Medicine | Admitting: Family Medicine

## 2024-03-01 DIAGNOSIS — Z1231 Encounter for screening mammogram for malignant neoplasm of breast: Secondary | ICD-10-CM

## 2024-03-03 LAB — COLOGUARD: COLOGUARD: NEGATIVE

## 2024-03-11 ENCOUNTER — Other Ambulatory Visit: Payer: Self-pay | Admitting: Family Medicine

## 2024-03-19 DIAGNOSIS — H353131 Nonexudative age-related macular degeneration, bilateral, early dry stage: Secondary | ICD-10-CM | POA: Diagnosis not present

## 2024-03-30 ENCOUNTER — Other Ambulatory Visit: Payer: Self-pay | Admitting: Family Medicine

## 2024-03-30 DIAGNOSIS — E782 Mixed hyperlipidemia: Secondary | ICD-10-CM

## 2024-03-31 ENCOUNTER — Other Ambulatory Visit: Payer: Self-pay

## 2024-03-31 ENCOUNTER — Emergency Department (HOSPITAL_BASED_OUTPATIENT_CLINIC_OR_DEPARTMENT_OTHER)

## 2024-03-31 ENCOUNTER — Emergency Department (HOSPITAL_BASED_OUTPATIENT_CLINIC_OR_DEPARTMENT_OTHER)
Admission: EM | Admit: 2024-03-31 | Discharge: 2024-04-01 | Disposition: A | Discharge status: Home / Self Care | Source: Home / Self Care | Attending: Emergency Medicine | Admitting: Emergency Medicine

## 2024-03-31 DIAGNOSIS — N201 Calculus of ureter: Secondary | ICD-10-CM | POA: Diagnosis not present

## 2024-03-31 DIAGNOSIS — I130 Hypertensive heart and chronic kidney disease with heart failure and stage 1 through stage 4 chronic kidney disease, or unspecified chronic kidney disease: Secondary | ICD-10-CM | POA: Diagnosis not present

## 2024-03-31 DIAGNOSIS — I5033 Acute on chronic diastolic (congestive) heart failure: Secondary | ICD-10-CM | POA: Insufficient documentation

## 2024-03-31 DIAGNOSIS — N189 Chronic kidney disease, unspecified: Secondary | ICD-10-CM | POA: Diagnosis not present

## 2024-03-31 DIAGNOSIS — R1110 Vomiting, unspecified: Secondary | ICD-10-CM | POA: Diagnosis not present

## 2024-03-31 DIAGNOSIS — Z79899 Other long term (current) drug therapy: Secondary | ICD-10-CM | POA: Insufficient documentation

## 2024-03-31 DIAGNOSIS — R109 Unspecified abdominal pain: Secondary | ICD-10-CM | POA: Diagnosis not present

## 2024-03-31 DIAGNOSIS — I10 Essential (primary) hypertension: Secondary | ICD-10-CM | POA: Diagnosis not present

## 2024-03-31 DIAGNOSIS — D72829 Elevated white blood cell count, unspecified: Secondary | ICD-10-CM | POA: Insufficient documentation

## 2024-03-31 DIAGNOSIS — I5032 Chronic diastolic (congestive) heart failure: Secondary | ICD-10-CM | POA: Diagnosis not present

## 2024-03-31 DIAGNOSIS — N132 Hydronephrosis with renal and ureteral calculous obstruction: Secondary | ICD-10-CM | POA: Insufficient documentation

## 2024-03-31 DIAGNOSIS — N184 Chronic kidney disease, stage 4 (severe): Secondary | ICD-10-CM | POA: Diagnosis not present

## 2024-03-31 DIAGNOSIS — R0989 Other specified symptoms and signs involving the circulatory and respiratory systems: Secondary | ICD-10-CM | POA: Diagnosis not present

## 2024-03-31 LAB — COMPREHENSIVE METABOLIC PANEL WITH GFR
ALT: 9 U/L (ref 0–44)
AST: 23 U/L (ref 15–41)
Albumin: 4.2 g/dL (ref 3.5–5.0)
Alkaline Phosphatase: 92 U/L (ref 38–126)
Anion gap: 14 (ref 5–15)
BUN: 23 mg/dL (ref 8–23)
CO2: 27 mmol/L (ref 22–32)
Calcium: 9.4 mg/dL (ref 8.9–10.3)
Chloride: 102 mmol/L (ref 98–111)
Creatinine, Ser: 1.58 mg/dL — ABNORMAL HIGH (ref 0.44–1.00)
GFR, Estimated: 33 mL/min — ABNORMAL LOW (ref >60)
Glucose, Bld: 187 mg/dL — ABNORMAL HIGH (ref 70–99)
Potassium: 3.9 mmol/L (ref 3.5–5.1)
Sodium: 143 mmol/L (ref 135–145)
Total Bilirubin: 0.7 mg/dL (ref 0.0–1.2)
Total Protein: 7.2 g/dL (ref 6.5–8.1)

## 2024-03-31 LAB — URINALYSIS, ROUTINE W REFLEX MICROSCOPIC
Bilirubin Urine: NEGATIVE
Glucose, UA: NEGATIVE mg/dL
Ketones, ur: NEGATIVE mg/dL
Nitrite: NEGATIVE
Protein, ur: 30 mg/dL — AB
RBC / HPF: >50 RBC/hpf (ref 0–5)
Specific Gravity, Urine: 1.022 (ref 1.005–1.030)
WBC, UA: >50 WBC/hpf (ref 0–5)
pH: 6 (ref 5.0–8.0)

## 2024-03-31 LAB — CBC
HCT: 37.7 % (ref 36.0–46.0)
Hemoglobin: 12.3 g/dL (ref 12.0–15.0)
MCH: 29.4 pg (ref 26.0–34.0)
MCHC: 32.6 g/dL (ref 30.0–36.0)
MCV: 90 fL (ref 80.0–100.0)
Platelets: 182 K/uL (ref 150–400)
RBC: 4.19 MIL/uL (ref 3.87–5.11)
RDW: 13.2 % (ref 11.5–15.5)
WBC: 10.9 K/uL — ABNORMAL HIGH (ref 4.0–10.5)
nRBC: 0 % (ref 0.0–0.2)

## 2024-03-31 LAB — TROPONIN T, HIGH SENSITIVITY: Troponin T High Sensitivity: 18 ng/L (ref <19)

## 2024-03-31 LAB — LIPASE, BLOOD: Lipase: 34 U/L (ref 11–51)

## 2024-03-31 MED ORDER — SODIUM CHLORIDE 0.9 % IV BOLUS
1000.0000 mL | Freq: Once | INTRAVENOUS | Status: AC
  Administered 2024-04-01: 1000 mL via INTRAVENOUS

## 2024-03-31 MED ORDER — ONDANSETRON HCL 4 MG/2ML IJ SOLN
4.0000 mg | Freq: Once | INTRAMUSCULAR | Status: AC
  Administered 2024-04-01: 4 mg via INTRAVENOUS
  Filled 2024-03-31: qty 2

## 2024-03-31 MED ORDER — FENTANYL CITRATE PF 50 MCG/ML IJ SOSY
50.0000 ug | PREFILLED_SYRINGE | Freq: Once | INTRAMUSCULAR | Status: AC
  Administered 2024-04-01: 50 ug via INTRAVENOUS
  Filled 2024-03-31: qty 1

## 2024-03-31 MED ORDER — SODIUM CHLORIDE 0.9 % IV SOLN
1.0000 g | Freq: Once | INTRAVENOUS | Status: AC
  Administered 2024-04-01: 1 g via INTRAVENOUS
  Filled 2024-03-31: qty 10

## 2024-03-31 MED ORDER — ONDANSETRON HCL 4 MG/2ML IJ SOLN
4.0000 mg | Freq: Once | INTRAMUSCULAR | Status: DC
  Filled 2024-03-31: qty 2

## 2024-03-31 NOTE — ED Triage Notes (Signed)
 CKD. HTN today. Emesis and left flank pain.

## 2024-03-31 NOTE — ED Provider Notes (Signed)
 [place] EMERGENCY DEPARTMENT AT [name] Provider Note   CSN: [id number] Arrival date & time: 03/31/24  2106     Patient presents with: flank pain, hypertension  Lisa Ortega is a 78 y.o. female.  {Add pertinent medical, surgical, social history, OB history to YEP:67052} The history is provided by the patient and a relative.  Patient w/history of hypertension, diastolic CHF, obesity presents with multiple complaints.  Patient has had recent intermittent episodes of left flank pain, worse over the past day.  She also reported upper abdominal pain.  Tonight she had 2 episodes of nonbloody emesis and elevated blood pressure.  No chest pain or shortness of breath Patient reports previous history of kidney stone but is never required intervention  No other urinary complaints reported Past Medical History:  Diagnosis Date   Acute on chronic diastolic (congestive) heart failure (HCC)    Essential (primary) hypertension    Essential hypertension, benign 11/21/2019   Hypertensive chronic kidney disease with stage 1 through stage 4 chronic kidney disease, or unspecified chronic kidney disease    Impaired fasting glucose    Localized edema    Memory loss 03/03/2021   Mixed hyperlipidemia    Morbid (severe) obesity due to excess calories (HCC)    Obesity, Class III, BMI 40-49.9 (morbid obesity) 11/23/2019   Other seasonal allergic rhinitis    Other specified nonscarring hair loss    Pain in left knee 01/21/2020   Telogen effluvium     Prior to Admission medications   Medication Sig Start Date End Date Taking? Authorizing Provider  atenolol  (TENORMIN ) 50 MG tablet Take 1 tablet by mouth twice daily 03/11/24   Cox, Kirsten, MD  Cholecalciferol (VITAMIN D3) 50 MCG (2000 UT) capsule Take 2,000 Units by mouth daily.    [provider]  Cyanocobalamin  (VITAMIN B 12) 250 MCG LOZG Take by mouth.    [provider]  furosemide  (LASIX ) 20 MG tablet Take 1 tablet by  mouth once daily 01/08/24   Cox, Kirsten, MD  KLOR-CON M20 20 MEQ tablet Take 20 mEq by mouth daily. 11/02/19   [provider]  losartan  (COZAAR ) 25 MG tablet Take 1 tablet by mouth once daily 02/12/24   Cox, Abigail, MD  rosuvastatin  (CRESTOR ) 5 MG tablet Take 1 tablet by mouth once daily 03/31/24   Cox, Abigail, MD    Allergies: Amlodipine and Lisinopril-hydrochlorothiazide    Review of Systems  Constitutional:  Negative for fever.  Respiratory:  Negative for shortness of breath.   Cardiovascular:  Negative for chest pain.  Gastrointestinal:  Positive for abdominal pain, constipation and vomiting. Negative for diarrhea.       Constipation for several days  Genitourinary:  Positive for flank pain. Negative for dysuria and frequency.    Updated Vital Signs BP (!) 191/73   Pulse 86   Temp 98.8 F (37.1 C) (Oral)   Resp 18   SpO2 97%   Physical Exam CONSTITUTIONAL: Elderly, flat affect HEAD: Normocephalic/atraumatic EYES: EOMI/PERRL ENMT: Mucous membranes moist NECK: supple no meningeal signs SPINE/BACK:entire spine nontender CV: S1/S2 noted, no murmurs/rubs/gallops noted LUNGS: Lungs are clear to auscultation bilaterally, no apparent distress ABDOMEN: soft, mild LUQ tenderness, no rebound or guarding, bowel sounds noted throughout abdomen GU:no cva tenderness NEURO: Pt is awake/alert/appropriate, moves all extremitiesx4.  No facial droop.   EXTREMITIES: pulses normal/equal, full ROM SKIN: warm, color normal PSYCH: Flat affect  (all labs ordered are listed, but only abnormal results are displayed) Labs Reviewed  CBC - Abnormal; Notable for the following components:      Result Value   WBC 10.9 (*)    All other components within normal limits  COMPREHENSIVE METABOLIC PANEL WITH GFR - Abnormal; Notable for the following components:   Glucose, Bld 187 (*)    Creatinine, Ser 1.58 (*)    GFR, Estimated 33 (*)    All other components within normal limits  URINALYSIS,  ROUTINE W REFLEX MICROSCOPIC - Abnormal; Notable for the following components:   Hgb urine dipstick LARGE (*)    Protein, ur 30 (*)    Leukocytes,Ua LARGE (*)    Bacteria, UA RARE (*)    All other components within normal limits  LIPASE, BLOOD  TROPONIN T, HIGH SENSITIVITY    EKG: EKG Interpretation Date/Time:  Sunday March 31 2024 21:30:24 EDT Ventricular Rate:  83 PR Interval:  190 QRS Duration:  78 QT Interval:  388 QTC Calculation: 455 R Axis:   -22  Text Interpretation: Normal sinus rhythm Low voltage QRS Cannot rule out Anteroseptal infarct Confirmed by Midge Golas ([id number]) on 03/31/2024 11:06:00 PM  Radiology: [facility] Chest Port 1 View Result Date: 03/31/2024 CLINICAL DATA:  Vomiting and left flank pain. EXAM: PORTABLE CHEST 1 VIEW COMPARISON:  April 01, 2022 FINDINGS: The cardiac silhouette is enlarged and unchanged in size. Low lung volumes are noted. No acute infiltrate, pleural effusion or pneumothorax is identified. Multilevel degenerative changes are seen throughout the thoracic spine. IMPRESSION: Low lung volumes without acute or active cardiopulmonary disease. Electronically Signed   By: Suzen Dials M.D.   On: 03/31/2024 21:55    {Document cardiac monitor, telemetry assessment procedure when appropriate:32947} Procedures   Medications Ordered in the ED  ondansetron  (ZOFRAN ) injection 4 mg (has no administration in time range)  sodium chloride  0.9 % bolus 1,000 mL (has no administration in time range)    Clinical Course as of 03/31/24 2316  Sun Mar 31, 2024  2315 Creatinine(!): 1.58 Renal insufficiency [DW]  2315 WBC(!): 10.9 Leukocytosis [DW]  2315 Leukocytes,Ua(!): LARGE [DW]  2315 RBC / HPF: >50 Hematuria [DW]    Clinical Course User Index [DW] Midge Golas, MD   {Click here for ABCD2, HEART and other calculators REFRESH Note before signing:1}                              Medical Decision Making Amount and/or Complexity of Data Reviewed Labs:  ordered. Decision-making details documented in ED Course. Radiology: ordered.  Risk Prescription drug management.   This patient presents to the ED for concern of flank pain, this involves an extensive number of treatment options, and is a complaint that carries with it a high risk of complications and morbidity.  The differential diagnosis includes but is not limited to muscle strain, AAA, pyelonephritis, ureteral stone, discitis, epidural abscess, pulmonary embolism  Comorbidities that complicate the patient evaluation: Patient's presentation is complicated by their history of hypertension and obesity  Social Determinants of Health: Patient's poor health literacy  increases the complexity of managing their presentation  Additional history obtained: Additional history obtained from family Records reviewed Care Everywhere/External Records and cardiology notes reviewed  Lab Tests: I Ordered, and personally interpreted labs.  The pertinent results include: Mild leukocytosis, renal sufficiency  Imaging Studies ordered: I ordered imaging studies including CT scan renal and X-ray chest  I independently visualized and interpreted imaging which showed *** I agree with the radiologist interpretation  Cardiac  Monitoring: The patient was maintained on a cardiac monitor.  I personally viewed and interpreted the cardiac monitor which showed an underlying rhythm of:  sinus rhythm  Medicines ordered and prescription drug management: I ordered medication including Zofran  for nausea Reevaluation of the patient after these medicines showed that the patient    {resolved/improved/worsened:23923::improved}  Test Considered: Patient is low risk / negative by ***, therefore do not feel that *** is indicated.  Critical Interventions:  ***  Consultations Obtained: I requested consultation with the {consultation:26851}, and discussed  findings as well as pertinent plan - they recommend:  ***  Reevaluation: After the interventions noted above, I reevaluated the patient and found that they have :{resolved/improved/worsened:23923::improved}  Complexity of problems addressed: Patient's presentation is most consistent with  acute presentation with potential threat to life or bodily function  Disposition: After consideration of the diagnostic results and the patient's response to treatment,  I feel that the patent would benefit from {disposition:26850}.     {Document critical care time when appropriate  Document review of labs and clinical decision tools ie CHADS2VASC2, etc  Document your independent review of radiology images and any outside records  Document your discussion with family members, caretakers and with consultants  Document social determinants of health affecting pt's care  Document your decision making why or why not admission, treatments were needed:32947:::1}   Final diagnoses:  None    ED Discharge Orders     None

## 2024-04-01 ENCOUNTER — Ambulatory Visit (HOSPITAL_COMMUNITY)
Admission: RE | Admit: 2024-04-01 | Discharge: 2024-04-01 | Disposition: A | Discharge status: Home / Self Care | Source: Ambulatory Visit | Attending: Urology | Admitting: Urology

## 2024-04-01 ENCOUNTER — Inpatient Hospital Stay (HOSPITAL_COMMUNITY): Admitting: Certified Registered Nurse Anesthetist

## 2024-04-01 ENCOUNTER — Other Ambulatory Visit: Payer: Self-pay | Admitting: Urology

## 2024-04-01 ENCOUNTER — Encounter (HOSPITAL_COMMUNITY)
Admission: RE | Disposition: A | Discharge status: Home / Self Care | Payer: Self-pay | Source: Ambulatory Visit | Attending: Urology

## 2024-04-01 ENCOUNTER — Inpatient Hospital Stay (HOSPITAL_COMMUNITY)

## 2024-04-01 DIAGNOSIS — N132 Hydronephrosis with renal and ureteral calculous obstruction: Secondary | ICD-10-CM

## 2024-04-01 DIAGNOSIS — I130 Hypertensive heart and chronic kidney disease with heart failure and stage 1 through stage 4 chronic kidney disease, or unspecified chronic kidney disease: Secondary | ICD-10-CM

## 2024-04-01 DIAGNOSIS — I5032 Chronic diastolic (congestive) heart failure: Secondary | ICD-10-CM | POA: Insufficient documentation

## 2024-04-01 DIAGNOSIS — N201 Calculus of ureter: Secondary | ICD-10-CM | POA: Diagnosis not present

## 2024-04-01 DIAGNOSIS — N184 Chronic kidney disease, stage 4 (severe): Secondary | ICD-10-CM | POA: Insufficient documentation

## 2024-04-01 DIAGNOSIS — N1832 Chronic kidney disease, stage 3b: Secondary | ICD-10-CM | POA: Diagnosis not present

## 2024-04-01 DIAGNOSIS — N23 Unspecified renal colic: Secondary | ICD-10-CM | POA: Diagnosis not present

## 2024-04-01 HISTORY — PX: CYSTOSCOPY W/ URETERAL STENT PLACEMENT: SHX1429

## 2024-04-01 LAB — GLUCOSE, CAPILLARY: Glucose-Capillary: 118 mg/dL — ABNORMAL HIGH (ref 70–99)

## 2024-04-01 SURGERY — CYSTOSCOPY, WITH RETROGRADE PYELOGRAM AND URETERAL STENT INSERTION
Anesthesia: General | Site: Bladder | Laterality: Left

## 2024-04-01 MED ORDER — CIPROFLOXACIN IN D5W 400 MG/200ML IV SOLN
400.0000 mg | INTRAVENOUS | Status: AC
  Administered 2024-04-01: 400 mg via INTRAVENOUS
  Filled 2024-04-01: qty 200

## 2024-04-01 MED ORDER — CHLORHEXIDINE GLUCONATE 0.12 % MT SOLN
15.0000 mL | Freq: Once | OROMUCOSAL | Status: AC
  Administered 2024-04-01: 15 mL via OROMUCOSAL

## 2024-04-01 MED ORDER — DEXAMETHASONE SODIUM PHOSPHATE 4 MG/ML IJ SOLN
INTRAMUSCULAR | Status: DC | PRN
  Administered 2024-04-01: 5 mg via INTRAVENOUS

## 2024-04-01 MED ORDER — ORAL CARE MOUTH RINSE: 15.0000 mL | Freq: Once | OROMUCOSAL | Status: AC

## 2024-04-01 MED ORDER — AMISULPRIDE (ANTIEMETIC) 5 MG/2ML IV SOLN: 10.0000 mg | Freq: Once | INTRAVENOUS | Status: DC | PRN

## 2024-04-01 MED ORDER — HYDROMORPHONE HCL 1 MG/ML IJ SOLN: 0.2500 mg | INTRAMUSCULAR | Status: DC | PRN

## 2024-04-01 MED ORDER — OXYCODONE HCL 5 MG PO TABS: 5.0000 mg | ORAL_TABLET | Freq: Once | ORAL | Status: DC | PRN

## 2024-04-01 MED ORDER — CELECOXIB 200 MG PO CAPS
200.0000 mg | ORAL_CAPSULE | Freq: Two times a day (BID) | ORAL | 1 refills | Status: AC
Start: 2024-04-01 — End: 2024-04-29

## 2024-04-01 MED ORDER — TAMSULOSIN HCL 0.4 MG PO CAPS
0.4000 mg | ORAL_CAPSULE | Freq: Every day | ORAL | 1 refills | Status: DC
Start: 2024-04-01 — End: 2024-04-19

## 2024-04-01 MED ORDER — HYOSCYAMINE SULFATE 0.125 MG SL SUBL: 0.1250 mg | SUBLINGUAL_TABLET | SUBLINGUAL | 0 refills | Status: DC | PRN

## 2024-04-01 MED ORDER — OXYCODONE HCL 5 MG/5ML PO SOLN: 5.0000 mg | Freq: Once | ORAL | Status: DC | PRN

## 2024-04-01 MED ORDER — LIDOCAINE HCL (CARDIAC) PF 100 MG/5ML IV SOSY
PREFILLED_SYRINGE | INTRAVENOUS | Status: DC | PRN
  Administered 2024-04-01: 60 mg via INTRATRACHEAL

## 2024-04-01 MED ORDER — SODIUM CHLORIDE 0.9 % IV SOLN: 12.5000 mg | INTRAVENOUS | Status: DC | PRN

## 2024-04-01 MED ORDER — ONDANSETRON HCL 4 MG/2ML IJ SOLN
INTRAMUSCULAR | Status: DC | PRN
  Administered 2024-04-01: 4 mg via INTRAVENOUS

## 2024-04-01 MED ORDER — LACTATED RINGERS IV SOLN: INTRAVENOUS | Status: DC

## 2024-04-01 MED ORDER — PROPOFOL 10 MG/ML IV BOLUS
INTRAVENOUS | Status: AC
  Filled 2024-04-01: qty 20

## 2024-04-01 MED ORDER — ONDANSETRON 4 MG PO TBDP: 4.0000 mg | ORAL_TABLET | Freq: Three times a day (TID) | ORAL | 0 refills | Status: DC | PRN

## 2024-04-01 MED ORDER — PROPOFOL 10 MG/ML IV BOLUS
INTRAVENOUS | Status: DC | PRN
  Administered 2024-04-01: 200 mg via INTRAVENOUS

## 2024-04-01 MED ORDER — DEXAMETHASONE SODIUM PHOSPHATE 10 MG/ML IJ SOLN
INTRAMUSCULAR | Status: AC
  Filled 2024-04-01: qty 1

## 2024-04-01 MED ORDER — HYDROCODONE-ACETAMINOPHEN 5-325 MG PO TABS: 1.0000 | ORAL_TABLET | Freq: Four times a day (QID) | ORAL | 0 refills | Status: DC | PRN

## 2024-04-01 MED ORDER — IOHEXOL 300 MG/ML  SOLN
INTRAMUSCULAR | Status: DC | PRN
Start: 2024-04-01 — End: 2024-04-01
  Administered 2024-04-01: 10 mL

## 2024-04-01 MED ORDER — SODIUM CHLORIDE 0.9 % IR SOLN
Status: DC | PRN
  Administered 2024-04-01: 3000 mL

## 2024-04-01 MED ORDER — INSULIN ASPART 100 UNIT/ML IJ SOLN: 0.0000 [IU] | INTRAMUSCULAR | Status: DC | PRN

## 2024-04-01 MED ORDER — CEPHALEXIN 500 MG PO CAPS: 500.0000 mg | ORAL_CAPSULE | Freq: Two times a day (BID) | ORAL | 0 refills | Status: DC

## 2024-04-01 MED ORDER — LIDOCAINE HCL (PF) 2 % IJ SOLN
INTRAMUSCULAR | Status: AC
  Filled 2024-04-01: qty 5

## 2024-04-01 MED ORDER — ONDANSETRON HCL 4 MG/2ML IJ SOLN
INTRAMUSCULAR | Status: AC
  Filled 2024-04-01: qty 2

## 2024-04-01 SURGICAL SUPPLY — 11 items
BAG URO CATCHER STRL LF (MISCELLANEOUS) ×1 IMPLANT
CATH URETL OPEN 5X70 (CATHETERS) ×1 IMPLANT
CLOTH BEACON ORANGE TIMEOUT ST (SAFETY) ×1 IMPLANT
GLOVE BIO SURGEON STRL SZ7 (GLOVE) ×1 IMPLANT
GOWN STRL REUS W/ TWL XL LVL3 (GOWN DISPOSABLE) ×1 IMPLANT
GUIDEWIRE ZIPWRE .038 STRAIGHT (WIRE) ×1 IMPLANT
KIT TURNOVER KIT A (KITS) ×1 IMPLANT
MANIFOLD NEPTUNE II (INSTRUMENTS) ×1 IMPLANT
PACK CYSTO (CUSTOM PROCEDURE TRAY) ×1 IMPLANT
STENT URET 6FRX24 CONTOUR (STENTS) IMPLANT
TUBING CONNECTING 10 (TUBING) ×1 IMPLANT

## 2024-04-01 NOTE — ED Notes (Signed)
 Pt placed on O2 2 LPM [facility] due to oxygen sat dropping due to pain meds being given. See MAR.

## 2024-04-01 NOTE — ED Notes (Signed)
 Water provided to patient

## 2024-04-01 NOTE — Discharge Instructions (Addendum)

## 2024-04-01 NOTE — Op Note (Signed)
 Operative Note  Preoperative diagnosis:  1.  Left obstructing ureteral stone  Postoperative diagnosis: 1.  same  Procedure(s): 1.  Cystoscopy with left ureteral stent placement 2. left retrograde pyelogram with interpretation 3. Fluoroscopy <1 hour with intraoperative interpretation  Surgeon: Herlene Foot, MD  Assistants:  None  Anesthesia:  General  Complications:  None  EBL:  minimal  Specimens: 1. None  Drains/Catheters: 1.  Left 6Fr x 24cm ureteral stent  Intraoperative findings:   Cystoscopy demonstrated no suspicious lesions, masses, stones or other pathology. Retrograde pyelogram demonstrated left hydronephrosis. There was a filling defect consistent with the stone. There was some immediate extravasation of contrast medially - seemed to be more consistent with extrav and not pyelo/venous outflow, although once again occurred as soon as contast entered collecting system. At the time, open ended ureteral catheter was in distal ureter and no wire had been inserted into collecting system Successful left ureteral stent placement with curl in the renal pelvis and bladder respectively.  Indication:  Lisa Ortega is a 78 y.o. female with left ureteral stone, refracty pain, N/V After reviewing the management options for treatment, she elected to proceed with the above surgical procedure(s). We have discussed the potential benefits and risks of the procedure, side effects of the proposed treatment, the likelihood of the patient achieving the goals of the procedure, and any potential problems that might occur during the procedure or recuperation. Informed consent has been obtained.  Description of procedure: The patient was taken to the operating room and general anesthesia was induced.  The patient was placed in the dorsal lithotomy position, prepped and draped in the usual sterile fashion, and preoperative antibiotics were administered. A preoperative time-out was performed.    Cystourethroscopy was performed.  The patient's urethra was examined and was normal. The bladder was then systematically examined in its entirety. There was no evidence for any bladder tumors, stones, or other mucosal pathology.    Attention then turned to the left ureteral orifice. A  5 Fr open ended catheter was inserted into the distal ureter. Omnipaque  contrast was injected through the ureteral catheter and a retrograde pyelogram was performed with findings as dictated above. A wire was then inserted easily into the upper pole and the open ended catheter was removed.   A 6Fr x 24cm ureteral stent was advance over the wire. The stent was positioned appropriately under fluoroscopic and cystoscopic guidance.  The wire was then removed with an adequate stent curl noted in the renal pelvis as well as in the bladder. There was return of stale appearing urine  The bladder was then emptied and the procedure ended.  The patient appeared to tolerate the procedure well and without complications.  The patient was able to be awakened and transferred to the recovery unit in satisfactory condition.   Plan:  d/c home; will plan for ureteroscopy to be done in a staged fashion.   KANDICE Herlene Foot MD [facility]  Pager: [phone]

## 2024-04-01 NOTE — Anesthesia Preprocedure Evaluation (Signed)
 Anesthesia Evaluation  Patient identified by MRN, date of birth, ID band Patient awake    Reviewed: Allergy & Precautions, H&P , NPO status , Patient's Chart, lab work & pertinent test results  Airway Mallampati: II  TM Distance: >3 FB Neck ROM: Full    Dental  (+) Dental Advisory Given   Pulmonary neg pulmonary ROS   Pulmonary exam normal breath sounds clear to auscultation       Cardiovascular hypertension, Pt. on medications +CHF and + DOE  Normal cardiovascular exam Rhythm:Regular Rate:Normal     Neuro/Psych negative neurological ROS  negative psych ROS   GI/Hepatic negative GI ROS, Neg liver ROS,,,  Endo/Other  negative endocrine ROS    Renal/GU Renal InsufficiencyRenal disease  negative genitourinary   Musculoskeletal  (+) Arthritis , Osteoarthritis,    Abdominal   Peds negative pediatric ROS (+)  Hematology negative hematology ROS (+)   Anesthesia Other Findings   Reproductive/Obstetrics negative OB ROS                              Anesthesia Physical Anesthesia Plan  ASA: 3  Anesthesia Plan: General   Post-op Pain Management:    Induction: Intravenous  PONV Risk Score and Plan: 3 and Ondansetron , Dexamethasone , Midazolam and Treatment may vary due to age or medical condition  Airway Management Planned: LMA  Additional Equipment:   Intra-op Plan:   Post-operative Plan: Extubation in OR  Informed Consent: I have reviewed the patients History and Physical, chart, labs and discussed the procedure including the risks, benefits and alternatives for the proposed anesthesia with the patient or authorized representative who has indicated his/her understanding and acceptance.     Dental advisory given  Plan Discussed with: CRNA  Anesthesia Plan Comments:         Anesthesia Quick Evaluation

## 2024-04-01 NOTE — H&P (Signed)
 H&P  History of Present Illness: Lisa Ortega is a 78 y.o. year old F who presents today for urgent placement of left stent for left stone  Patient notes severe left flank pain and severe N/V  Past Medical History:  Diagnosis Date   Acute on chronic diastolic (congestive) heart failure (HCC)    Essential (primary) hypertension    Essential hypertension, benign 11/21/2019   Hypertensive chronic kidney disease with stage 1 through stage 4 chronic kidney disease, or unspecified chronic kidney disease    Impaired fasting glucose    Localized edema    Memory loss 03/03/2021   Mixed hyperlipidemia    Morbid (severe) obesity due to excess calories (HCC)    Obesity, Class III, BMI 40-49.9 (morbid obesity) 11/23/2019   Other seasonal allergic rhinitis    Other specified nonscarring hair loss    Pain in left knee 01/21/2020   Telogen effluvium     Past Surgical History:  Procedure Laterality Date   CHOLECYSTECTOMY  04/01/2022   Dr Margert   TONSILLECTOMY      Home Medications:  No outpatient medications have been marked as taking for the 04/01/24 encounter [facility] Encounter).    Allergies:  Allergies  Allergen Reactions   Amlodipine Other (See Comments)    Alopecia   Lisinopril-Hydrochlorothiazide     Family History  Problem Relation Age of Onset   Transient ischemic attack Mother    Alzheimer's disease Mother    Stroke Father    Throat cancer Father    Breast cancer Sister 60   Pulmonary embolism Brother    Breast cancer Niece 36 - 24   Asthma Other    Diabetes type II Other    Hypertension Other    Hypothyroidism Other    Heart attack Other    Migraines Other    Osteoarthritis Other     Social History:  reports that she has never smoked. She has never used smokeless tobacco. She reports that she does not drink alcohol and does not use drugs.  ROS: A complete review of systems was performed.  All systems are negative except for pertinent findings as  noted.  Physical Exam:  Vital signs in last 24 hours: Temp:  [98.4 F (36.9 C)-98.8 F (37.1 C)] 98.4 F (36.9 C) (07/21 0148) Pulse Rate:  [74-86] 74 (07/21 0148) Resp:  [16-18] 16 (07/21 0148) BP: (165-191)/(72-81) 185/81 (07/21 0148) SpO2:  [97 %-99 %] 99 % (07/21 0148) Constitutional:  Alert and oriented, No acute distress Cardiovascular: Regular rate and rhythm Respiratory: Normal respiratory effort, Lungs clear bilaterally GI: Abdomen is soft, nontender, nondistended, no abdominal masses Lymphatic: No lymphadenopathy Neurologic: Grossly intact, no focal deficits Psychiatric: Normal mood and affect   Laboratory Data:  Recent Labs    03/31/24 2111  WBC 10.9*  HGB 12.3  HCT 37.7  PLT 182    Recent Labs    03/31/24 2112  NA 143  K 3.9  CL 102  GLUCOSE 187*  BUN 23  CALCIUM  9.4  CREATININE 1.58*     Results for orders placed or performed during the hospital encounter of 03/31/24 (from the past 24 hours)  CBC     Status: Abnormal   Collection Time: 03/31/24  9:11 PM  Result Value Ref Range   WBC 10.9 (H) 4.0 - 10.5 K/uL   RBC 4.19 3.87 - 5.11 MIL/uL   Hemoglobin 12.3 12.0 - 15.0 g/dL   HCT 62.2 63.9 - 53.9 %   MCV 90.0 80.0 -  100.0 fL   MCH 29.4 26.0 - 34.0 pg   MCHC 32.6 30.0 - 36.0 g/dL   RDW 86.7 88.4 - 84.4 %   Platelets 182 150 - 400 K/uL   nRBC 0.0 0.0 - 0.2 %  Troponin T, High Sensitivity     Status: None   Collection Time: 03/31/24  9:11 PM  Result Value Ref Range   Troponin T High Sensitivity 18 <19 ng/L  Lipase, blood     Status: None   Collection Time: 03/31/24  9:11 PM  Result Value Ref Range   Lipase 34 11 - 51 U/L  Comprehensive metabolic panel     Status: Abnormal   Collection Time: 03/31/24  9:12 PM  Result Value Ref Range   Sodium 143 135 - 145 mmol/L   Potassium 3.9 3.5 - 5.1 mmol/L   Chloride 102 98 - 111 mmol/L   CO2 27 22 - 32 mmol/L   Glucose, Bld 187 (H) 70 - 99 mg/dL   BUN 23 8 - 23 mg/dL   Creatinine, Ser 8.41 (H)  0.44 - 1.00 mg/dL   Calcium  9.4 8.9 - 10.3 mg/dL   Total Protein 7.2 6.5 - 8.1 g/dL   Albumin 4.2 3.5 - 5.0 g/dL   AST 23 15 - 41 U/L   ALT 9 0 - 44 U/L   Alkaline Phosphatase 92 38 - 126 U/L   Total Bilirubin 0.7 0.0 - 1.2 mg/dL   GFR, Estimated 33 (L) >60 mL/min   Anion gap 14 5 - 15  Urinalysis, Routine w reflex microscopic -Urine, Clean Catch     Status: Abnormal   Collection Time: 03/31/24  9:12 PM  Result Value Ref Range   Color, Urine YELLOW YELLOW   APPearance CLEAR CLEAR   Specific Gravity, Urine 1.022 1.005 - 1.030   pH 6.0 5.0 - 8.0   Glucose, UA NEGATIVE NEGATIVE mg/dL   Hgb urine dipstick LARGE (A) NEGATIVE   Bilirubin Urine NEGATIVE NEGATIVE   Ketones, ur NEGATIVE NEGATIVE mg/dL   Protein, ur 30 (A) NEGATIVE mg/dL   Nitrite NEGATIVE NEGATIVE   Leukocytes,Ua LARGE (A) NEGATIVE   RBC / HPF >50 0 - 5 RBC/hpf   WBC, UA >50 0 - 5 WBC/hpf   Bacteria, UA RARE (A) NONE SEEN   Squamous Epithelial / HPF 6-10 0 - 5 /HPF   Mucus PRESENT    No results found for this or any previous visit (from the past 240 hours).  Renal Function: Recent Labs    03/31/24 2112  CREATININE 1.58*   CrCl cannot be calculated (Unknown ideal weight.).  Radiologic Imaging: CT Renal Stone Study Result Date: 03/31/2024 EXAM: CT ABDOMEN AND PELVIS WITHOUT CONTRAST 03/31/2024 11:36:54 PM TECHNIQUE: CT of the abdomen and pelvis was performed without the administration of intravenous contrast. Multiplanar reformatted images are provided for review. Automated exposure control, iterative reconstruction, and/or weight based adjustment of the mA/kV was utilized to reduce the radiation dose to as low as reasonably achievable. COMPARISON: 03/30/2022 CLINICAL HISTORY: Abdominal/flank pain, stone suspected. CKD. HTN today. Emesis and left flank pain. FINDINGS: LOWER CHEST: No acute abnormality. LIVER: The liver is unremarkable. GALLBLADDER AND BILE DUCTS: Status post cholecystectomy. No biliary ductal  dilatation. SPLEEN: No acute abnormality. PANCREAS: No acute abnormality. ADRENAL GLANDS: No acute abnormality. KIDNEYS, URETERS AND BLADDER: Mild left hydronephrosis with associated 17 mm calculus in the left renal pelvis/proximal ureter (image 29). No perinephric or periureteral stranding. Urinary bladder is unremarkable. GI AND BOWEL: Normal appendix (  image 51). Stomach demonstrates no acute abnormality. There is no bowel obstruction. No bowel wall thickening. PERITONEUM AND RETROPERITONEUM: No ascites. No free air. VASCULATURE: Atherosclerotic calcifications of the abdominal aorta and branch vessels. LYMPH NODES: No lymphadenopathy. REPRODUCTIVE ORGANS: Uterus is within normal limits. BONES AND SOFT TISSUES: Mild degenerative changes of the visualized thoracolumbar spine. No acute osseous abnormality. No focal soft tissue abnormality. IMPRESSION: 1. Mild left hydronephrosis with associated 17 mm calculus in the left renal pelvis/proximal ureter. Electronically signed by: Pinkie Pebbles MD 03/31/2024 11:40 PM EDT RP Workstation: [id number]   DG Chest Port 1 View Result Date: 03/31/2024 CLINICAL DATA:  Vomiting and left flank pain. EXAM: PORTABLE CHEST 1 VIEW COMPARISON:  April 01, 2022 FINDINGS: The cardiac silhouette is enlarged and unchanged in size. Low lung volumes are noted. No acute infiltrate, pleural effusion or pneumothorax is identified. Multilevel degenerative changes are seen throughout the thoracic spine. IMPRESSION: Low lung volumes without acute or active cardiopulmonary disease. Electronically Signed   By: Suzen Dials M.D.   On: 03/31/2024 21:55    Assessment:  Lisa Ortega is a 78 y.o. year old F with large obstructing left ureteral stone   Plan:  --to OR as planned for cystoscopy with left stent, retrograde. Procedure and risks reviewed, including but not limited to hematuria, infection, sepsis, damage to GU tract, failure to complete procedure, need for future procedures,  stent pain, prolonged stent.   Herlene Foot, MD 04/01/2024, 11:58 AM  [facility] Pager: [phone]

## 2024-04-01 NOTE — Anesthesia Postprocedure Evaluation (Signed)
 Anesthesia Post Note  Patient: Lisa Ortega, Lisa Ortega  Procedure(s) Performed: CYSTOSCOPY, WITH RETROGRADE PYELOGRAM AND URETERAL STENT INSERTION (Left: Bladder)     Patient location during evaluation: PACU Anesthesia Type: General Level of consciousness: awake and alert Pain management: pain level controlled Vital Signs Assessment: post-procedure vital signs reviewed and stable Respiratory status: spontaneous breathing, nonlabored ventilation and respiratory function stable Cardiovascular status: blood pressure returned to baseline and stable Postop Assessment: no apparent nausea or vomiting Anesthetic complications: no   No notable events documented.  Last Vitals:  Vitals:   04/01/24 1408 04/01/24 1415  BP: 139/86 (!) 140/88  Pulse: 65 73  Resp: 14 16  Temp: 36.5 C 36.4 C  SpO2: 96% 94%    Last Pain:  Vitals:   04/01/24 1415  TempSrc:   PainSc: 0-No pain                 Butler Levander Pinal

## 2024-04-01 NOTE — Anesthesia Procedure Notes (Signed)
 Procedure Name: LMA Insertion Date/Time: 04/01/2024 1:04 PM  Performed by: Judythe Tanda Aran, CRNAPre-anesthesia Checklist: Emergency Drugs available, Patient identified, Suction available and Patient being monitored Patient Re-evaluated:Patient Re-evaluated prior to induction Oxygen Delivery Method: Circle system utilized Preoxygenation: Pre-oxygenation with 100% oxygen Induction Type: IV induction Ventilation: Mask ventilation without difficulty LMA: LMA inserted LMA Size: 4.0 Number of attempts: 1 Placement Confirmation: positive ETCO2 and breath sounds checked- equal and bilateral Tube secured with: Tape Dental Injury: Teeth and Oropharynx as per pre-operative assessment

## 2024-04-01 NOTE — Transfer of Care (Signed)
 Immediate Anesthesia Transfer of Care Note  Patient: Lisa Ortega  Procedure(s) Performed: CYSTOSCOPY, WITH RETROGRADE PYELOGRAM AND URETERAL STENT INSERTION (Left: Bladder)  Patient Location: PACU  Anesthesia Type:General  Level of Consciousness: drowsy  Airway & Oxygen Therapy: Patient Spontanous Breathing and Patient connected to nasal cannula oxygen  Post-op Assessment: Report given to RN and Post -op Vital signs reviewed and stable  Post vital signs: Reviewed and stable  Last Vitals:  Vitals Value Taken Time  BP 126/54 04/01/24 13:38  Temp    Pulse 60 04/01/24 13:40  Resp 12 04/01/24 13:40  SpO2 99 % 04/01/24 13:40  Vitals shown include unfiled device data.  Last Pain:  Vitals:   04/01/24 1230  TempSrc: Oral  PainSc: 0-No pain         Complications: No notable events documented.

## 2024-04-02 ENCOUNTER — Encounter (HOSPITAL_COMMUNITY): Payer: Self-pay | Admitting: Urology

## 2024-04-02 LAB — URINE CULTURE: Culture: <10000 — AB

## 2024-04-04 ENCOUNTER — Other Ambulatory Visit: Payer: Self-pay | Admitting: Urology

## 2024-04-07 ENCOUNTER — Other Ambulatory Visit: Payer: Self-pay | Admitting: Family Medicine

## 2024-04-07 DIAGNOSIS — I5032 Chronic diastolic (congestive) heart failure: Secondary | ICD-10-CM

## 2024-04-09 NOTE — Progress Notes (Addendum)
 Anesthesia Review:  PCP: Kirstein Cox in [place]  LOV 10/20/23 with Dr Sirivol  Cardiologist :  Revankar LOV 03/04/21   PPM/ ICD: Device Orders: Rep Notified:  Chest x-ray : 03/31/24- 1 view  EKG : 04/02/24  Echo : 2022 Stress test: 2022  Cardiac Cath :   Activity level: can do a flight of stiars slowly without difficugtly  Sleep Study/ CPAP : has sleep apnea no cpap  Fasting Blood Sugar :      / Checks Blood Sugar -- times a day:    Blood Thinner/ Instructions /Last Dose: ASA / Instructions/ Last Dose :    03/31/24- cbc and CMP  Labs repeated on 04/15/2024.   03/31/25- In ED  04/01/24- cysto   Since 04/01/24 pt reports she has been doing fine at home without any issues.   Daughter with pt at preop appt.  PT reports having some memory loss at preop appt.     Hearing Aids  Uses cane    BMP done 04/15/24 routed to Dr Lovie.

## 2024-04-10 NOTE — Patient Instructions (Addendum)
 SURGICAL WAITING ROOM VISITATION  Patients having surgery or a procedure may have no more than 2 support people in the waiting area - these visitors may rotate.    Children under the age of 44 must have an adult with them who is not the patient.  Visitors with respiratory illnesses are discouraged from visiting and should remain at home.  If the patient needs to stay at the hospital during part of their recovery, the visitor guidelines for inpatient rooms apply. Pre-op nurse will coordinate an appropriate time for 1 support person to accompany patient in pre-op.  This support person may not rotate.    Please refer to the [name] website for the visitor guidelines for Inpatients (after your surgery is over and you are in a regular room).       Your procedure is scheduled on:  04/23/2024    Report to [facility] Main Entrance    Report to admitting at   [phone]   Call this number if you have problems the morning of surgery [phone]   Do not eat food or drink liquids  :After Midnight.               If you have questions, please contact your surgeon's office.       Oral Hygiene is also important to reduce your risk of infection.                                    Remember - BRUSH YOUR TEETH THE MORNING OF SURGERY WITH YOUR REGULAR TOOTHPASTE  DENTURES WILL BE REMOVED PRIOR TO SURGERY PLEASE DO NOT APPLY Poly grip OR ADHESIVES!!!   Do NOT smoke after Midnight   Stop all vitamins and herbal supplements 7 days before surgery.   Take these medicines the morning of surgery with A SIP OF WATER: Atenolol    DO NOT TAKE ANY ORAL DIABETIC MEDICATIONS DAY OF YOUR SURGERY  Bring CPAP mask and tubing day of surgery.                              You may not have any metal on your body including hair pins, jewelry, and body piercing             Do not wear make-up, lotions, powders, perfumes/cologne, or deodorant  Do not wear nail polish including gel and S&S,  artificial/acrylic nails, or any other type of covering on natural nails including finger and toenails. If you have artificial nails, gel coating, etc. that needs to be removed by a nail salon please have this removed prior to surgery or surgery may need to be canceled/ delayed if the surgeon/ anesthesia feels like they are unable to be safely monitored.   Do not shave  48 hours prior to surgery.               Men may shave face and neck.   Do not bring valuables to the hospital. [facility] IS NOT             RESPONSIBLE   FOR VALUABLES.   Contacts, glasses, dentures or bridgework may not be worn into surgery.   Bring small overnight bag day of surgery.   DO NOT BRING YOUR HOME MEDICATIONS TO THE HOSPITAL. PHARMACY WILL DISPENSE MEDICATIONS LISTED ON YOUR MEDICATION LIST TO YOU DURING YOUR ADMISSION IN THE  HOSPITAL!    Patients discharged on the day of surgery will not be allowed to drive home.  Someone NEEDS to stay with you for the first 24 hours after anesthesia.   Special Instructions: Bring a copy of your healthcare power of attorney and living will documents the day of surgery if you haven't scanned them before.              Please read over the following fact sheets you were given: IF YOU HAVE QUESTIONS ABOUT YOUR PRE-OP INSTRUCTIONS PLEASE CALL [phone].   If you received a COVID test during your pre-op visit  it is requested that you wear a mask when out in public, stay away from anyone that may not be feeling well and notify your surgeon if you develop symptoms. If you test positive for Covid or have been in contact with anyone that has tested positive in the last 10 days please notify you surgeon.    [place] - Preparing for Surgery Before surgery, you can play an important role.  Because skin is not sterile, your skin needs to be as free of germs as possible.  You can reduce the number of germs on your skin by washing with CHG (chlorahexidine gluconate) soap before surgery.   CHG is an antiseptic cleaner which kills germs and bonds with the skin to continue killing germs even after washing. Please DO NOT use if you have an allergy to CHG or antibacterial soaps.  If your skin becomes reddened/irritated stop using the CHG and inform your nurse when you arrive at Short Stay. Do not shave (including legs and underarms) for at least 48 hours prior to the first CHG shower.  You may shave your face/neck. Please follow these instructions carefully:  1.  Shower with CHG Soap the night before surgery and the  morning of Surgery.  2.  If you choose to wash your hair, wash your hair first as usual with your  normal  shampoo.  3.  After you shampoo, rinse your hair and body thoroughly to remove the  shampoo.                           4.  Use CHG as you would any other liquid soap.  You can apply chg directly  to the skin and wash                       Gently with a scrungie or clean washcloth.  5.  Apply the CHG Soap to your body ONLY FROM THE NECK DOWN.   Do not use on face/ open                           Wound or open sores. Avoid contact with eyes, ears mouth and genitals (private parts).                       Wash face,  Genitals (private parts) with your normal soap.             6.  Wash thoroughly, paying special attention to the area where your surgery  will be performed.  7.  Thoroughly rinse your body with warm water from the neck down.  8.  DO NOT shower/wash with your normal soap after using and rinsing off  the CHG Soap.  9.  Pat yourself dry with a clean towel.            10.  Wear clean pajamas.            11.  Place clean sheets on your bed the night of your first shower and do not  sleep with pets. Day of Surgery : Do not apply any lotions/deodorants the morning of surgery.  Please wear clean clothes to the hospital/surgery center.  FAILURE TO FOLLOW THESE INSTRUCTIONS MAY RESULT IN THE CANCELLATION OF YOUR SURGERY PATIENT  SIGNATURE_________________________________  NURSE SIGNATURE__________________________________  ________________________________________________________________________

## 2024-04-15 ENCOUNTER — Encounter (HOSPITAL_COMMUNITY)
Admission: RE | Admit: 2024-04-15 | Discharge: 2024-04-15 | Disposition: A | Discharge status: Home / Self Care | Source: Ambulatory Visit | Attending: Urology | Admitting: Urology

## 2024-04-15 ENCOUNTER — Encounter (HOSPITAL_COMMUNITY): Payer: Self-pay

## 2024-04-15 ENCOUNTER — Other Ambulatory Visit: Payer: Self-pay

## 2024-04-15 VITALS — BP 185/74 | HR 65 | Temp 98.5°F | Resp 16 | Ht 62.0 in | Wt 229.3 lb

## 2024-04-15 DIAGNOSIS — Z01812 Encounter for preprocedural laboratory examination: Secondary | ICD-10-CM | POA: Diagnosis not present

## 2024-04-15 DIAGNOSIS — Z01818 Encounter for other preprocedural examination: Secondary | ICD-10-CM | POA: Diagnosis not present

## 2024-04-15 HISTORY — DX: Unspecified osteoarthritis, unspecified site: M19.90

## 2024-04-15 HISTORY — DX: Personal history of urinary calculi: Z87.442

## 2024-04-15 HISTORY — DX: Dyspnea, unspecified: R06.00

## 2024-04-15 HISTORY — DX: Sleep apnea, unspecified: G47.30

## 2024-04-15 HISTORY — DX: Prediabetes: R73.03

## 2024-04-15 LAB — BASIC METABOLIC PANEL WITH GFR
Anion gap: 9 (ref 5–15)
BUN: 37 mg/dL — ABNORMAL HIGH (ref 8–23)
CO2: 28 mmol/L (ref 22–32)
Calcium: 9.3 mg/dL (ref 8.9–10.3)
Chloride: 106 mmol/L (ref 98–111)
Creatinine, Ser: 1.6 mg/dL — ABNORMAL HIGH (ref 0.44–1.00)
GFR, Estimated: 33 mL/min — ABNORMAL LOW (ref >60)
Glucose, Bld: 126 mg/dL — ABNORMAL HIGH (ref 70–99)
Potassium: 5.1 mmol/L (ref 3.5–5.1)
Sodium: 143 mmol/L (ref 135–145)

## 2024-04-15 LAB — CBC
HCT: 39.5 % (ref 36.0–46.0)
Hemoglobin: 12.2 g/dL (ref 12.0–15.0)
MCH: 28.7 pg (ref 26.0–34.0)
MCHC: 30.9 g/dL (ref 30.0–36.0)
MCV: 92.9 fL (ref 80.0–100.0)
Platelets: 172 K/uL (ref 150–400)
RBC: 4.25 MIL/uL (ref 3.87–5.11)
RDW: 13.2 % (ref 11.5–15.5)
WBC: 7.4 K/uL (ref 4.0–10.5)
nRBC: 0 % (ref 0.0–0.2)

## 2024-04-16 ENCOUNTER — Encounter (HOSPITAL_COMMUNITY): Payer: Self-pay

## 2024-04-16 NOTE — Progress Notes (Addendum)
 Case: [id number] Date/Time: 04/23/24 0930   Procedure: CYSTOSCOPY/URETEROSCOPY/HOLMIUM LASER/STENT PLACEMENT (Left)   Anesthesia type: General   Diagnosis: Ureteral stone [N20.1]   Pre-op diagnosis: LEFT URETERAL STONE   Location: [facility] ROOM 03 / [facility]   Surgeons: Lovie Arlyss CROME, MD       DISCUSSION: Lisa Ortega is a 78 yo female with PMH of HTN, HFpEF, ?OSA (no CPAP), prediabetes, kidney stones, CKD, obesity (BMI 41), arthritis.  Patient presented to the ED on 7/20.  And was found to have an obstructing stone. Taken to the OR on 7/21 with Dr. Lovie for L ureteral stent placement. LMA used. No complications noted.  Patient seen by PCP in clinic on 04/19/2024 for routine follow-up and preop evaluation.  Blood pressure noted to be well-controlled.  Blood pressure controlled.  Patient may have diagnosis of OSA but per notes she has refused testing because she does not want to wear a CPAP. Advised follow-up in 4 months.  Patient evaluated remotely by cardiology in 2022 for shortness of breath and heart murmur.  She underwent stress testing and echo.  Stress test was low risk.  Echo showed normal LVEF 60 to 65% with grade 2 diastolic dysfunction and no significant valvular disease.  VS: BP (!) 185/74   Pulse 65   Temp 36.9 C (Oral)   Resp 16   Ht 5' 2 (1.575 m)   Wt 104 kg   SpO2 99%   BMI 41.94 kg/m   PROVIDERS: CoxAbigail, MD   LABS: Labs reviewed: Acceptable for surgery. (all labs ordered are listed, but only abnormal results are displayed)  Labs Reviewed  BASIC METABOLIC PANEL WITH GFR - Abnormal; Notable for the following components:      Result Value   Glucose, Bld 126 (*)    BUN 37 (*)    Creatinine, Ser 1.60 (*)    GFR, Estimated 33 (*)    All other components within normal limits  CBC     IMAGES:   EKG 03/31/24:  Normal sinus rhythm, rate 83 Low voltage QRS Cannot rule out Anteroseptal infarct  CV:  Echo 03/24/2021:  IMPRESSIONS    1. Left  ventricular ejection fraction, by estimation, is 60 to 65%. The left ventricle has normal function. The left ventricle has no regional wall motion abnormalities. There is moderate concentric left ventricular hypertrophy. Left ventricular diastolic parameters are consistent with Grade II diastolic dysfunction (pseudonormalization).  2. Right ventricular systolic function is normal. The right ventricular size is normal. There is normal pulmonary artery systolic pressure.  3. The mitral valve is degenerative. No evidence of mitral valve regurgitation. No evidence of mitral stenosis.  4. The aortic valve is tricuspid. Aortic valve regurgitation is not visualized. Mild aortic valve sclerosis is present, with no evidence of aortic valve stenosis.  5. The inferior vena cava is normal in size with greater than 50% respiratory variability, suggesting right atrial pressure of 3 mmHg.   Stress test 03/10/2021:  Nuclear stress EF: 76%. There was no ST segment deviation noted during stress. The study is normal. This is a low risk study. The left ventricular ejection fraction is hyperdynamic (>65%).  Past Medical History:  Diagnosis Date   Acute on chronic diastolic (congestive) heart failure (HCC)    Arthritis    Dyspnea    Essential (primary) hypertension    Essential hypertension, benign 11/21/2019   History of kidney stones    Hypertensive chronic kidney disease with stage 1 through stage 4 chronic kidney  disease, or unspecified chronic kidney disease    Impaired fasting glucose    Localized edema    Memory loss 03/03/2021   Mixed hyperlipidemia    Morbid (severe) obesity due to excess calories (HCC)    Obesity, Class III, BMI 40-49.9 (morbid obesity) 11/23/2019   Other seasonal allergic rhinitis    Other specified nonscarring hair loss    Pain in left knee 01/21/2020   Pre-diabetes    Sleep apnea    Telogen effluvium     Past Surgical History:  Procedure Laterality Date    CHOLECYSTECTOMY  04/01/2022   Dr Margert   CYSTOSCOPY W/ URETERAL STENT PLACEMENT Left 04/01/2024   Procedure: CYSTOSCOPY, WITH RETROGRADE PYELOGRAM AND URETERAL STENT INSERTION;  Surgeon: Lovie Arlyss CROME, MD;  Location[facility];  Service: Urology;  Laterality: Left;   TONSILLECTOMY      MEDICATIONS:  atenolol  (TENORMIN ) 50 MG tablet   celecoxib  (CELEBREX ) 200 MG capsule   cephALEXin  (KEFLEX ) 500 MG capsule   Cholecalciferol (VITAMIN D3) 50 MCG (2000 UT) capsule   Cyanocobalamin  (VITAMIN B 12 PO)   furosemide  (LASIX ) 20 MG tablet   HYDROcodone -acetaminophen  (NORCO/VICODIN) 5-325 MG tablet   hyoscyamine  (LEVSIN /SL) 0.125 MG SL tablet   KLOR-CON M20 20 MEQ tablet   losartan  (COZAAR ) 25 MG tablet   Multiple Vitamins-Minerals (LUTEIN-ZEAXANTHIN PO)   ondansetron  (ZOFRAN -ODT) 4 MG disintegrating tablet   rosuvastatin  (CRESTOR ) 5 MG tablet   tamsulosin  (FLOMAX ) 0.4 MG CAPS capsule   No current facility-administered medications for this encounter.   Burnard CHRISTELLA Odis DEVONNA [facility] Short Stay/Anesthesiology [facility] Phone [phone] 04/22/2024 8:33 AM

## 2024-04-19 ENCOUNTER — Ambulatory Visit (INDEPENDENT_AMBULATORY_CARE_PROVIDER_SITE_OTHER): Payer: Medicare Other | Admitting: Family Medicine

## 2024-04-19 ENCOUNTER — Encounter: Payer: Self-pay | Admitting: Family Medicine

## 2024-04-19 VITALS — BP 120/72 | HR 80 | Temp 97.3°F | Resp 16 | Ht 62.0 in | Wt 224.0 lb

## 2024-04-19 DIAGNOSIS — E782 Mixed hyperlipidemia: Secondary | ICD-10-CM

## 2024-04-19 DIAGNOSIS — N1832 Chronic kidney disease, stage 3b: Secondary | ICD-10-CM | POA: Diagnosis not present

## 2024-04-19 DIAGNOSIS — R7301 Impaired fasting glucose: Secondary | ICD-10-CM

## 2024-04-19 DIAGNOSIS — I5032 Chronic diastolic (congestive) heart failure: Secondary | ICD-10-CM | POA: Diagnosis not present

## 2024-04-19 DIAGNOSIS — N200 Calculus of kidney: Secondary | ICD-10-CM

## 2024-04-19 DIAGNOSIS — I129 Hypertensive chronic kidney disease with stage 1 through stage 4 chronic kidney disease, or unspecified chronic kidney disease: Secondary | ICD-10-CM

## 2024-04-19 NOTE — Progress Notes (Signed)
 Subjective:  Patient ID: Lisa Ortega, female    DOB: 06-17-1946  Age: 78 y.o. MRN: [id number]  Chief Complaint  Patient presents with   Medical Management of Chronic Issues   Discussed the use of AI scribe software for clinical note transcription with the patient, who gave verbal consent to proceed.  History of Present Illness   Aneita Reilyn Ortega is a 78 year old female who presents for regular follow-up and pre-operative evaluation for a left kidney stone.  Nephrolithiasis and ureteral stent - Left kidney stone diagnosed with hospitalization on July 21st - Ureteral stent placed during hospitalization; stent remains in place - Scheduled for cystoscopy and ureteroscopy next Tuesday for stone removal - Stent causes minimal discomfort compared to prior kidney stone pain  Hyperlipidemia:  Patient is currently taking Rosuvastatin  5 mg take 1 tablet daily.    Hypertension with CKD: Patient is currently taking Atenolol  50 mg take 1 tablet twice daily, Losartan  25 mg take 1 tablet daily, Lasix  20 mg daily   Hypokalemia: Taking potassium chloride 20 meq 1/2 daily. Patient sees  Dr. Katheleen, nephrologist at [facility] annually.    Prediabetes: Last A1c: 5.8 Does not eat healthy or exercise.    Memory loss: saw neurology. MRI of brain showed mild atrophy and some vascular changes. Taking B12 lozenges.     04/19/2024    9:24 AM 01/25/2024    2:13 PM 10/20/2023   10:22 AM 12/20/2022    1:35 PM 08/31/2022    9:52 AM  Depression screen PHQ 2/9  Decreased Interest 0 0 0 0 0  Down, Depressed, Hopeless 0 0 0 0 0  PHQ - 2 Score 0 0 0 0 0  Altered sleeping 0  0    Tired, decreased energy 0  0    Change in appetite 0  0    Feeling bad or failure about yourself  0  0    Trouble concentrating 0  0    Moving slowly or fidgety/restless 0  0    Suicidal thoughts 0  0    PHQ-9 Score 0  0    Difficult doing work/chores Not difficult at all  Not difficult at all          04/19/2024     9:24 AM  Fall Risk   Falls in the past year? 0  Number falls in past yr: 0  Injury with Fall? 0  Risk for fall due to : No Fall Risks  Follow up Education provided    Patient Care Team: Sherre Clapper, MD as PCP - General (Family Medicine) Dolan Mateo Larger, MD as Consulting Physician (Nephrology) Erasmo Bernardino BRAVO, OD (Optometry)   Review of Systems  Constitutional:  Negative for chills, fatigue and fever.  HENT:  Negative for congestion, ear pain and sore throat.   Respiratory:  Negative for cough and shortness of breath.   Cardiovascular:  Negative for chest pain and palpitations.  Gastrointestinal:  Negative for abdominal pain, constipation, diarrhea, nausea and vomiting.  Endocrine: Negative for polydipsia, polyphagia and polyuria.  Genitourinary:  Negative for difficulty urinating and dysuria.  Musculoskeletal:  Negative for arthralgias, back pain and myalgias.  Skin:  Negative for rash.  Neurological:  Negative for headaches.  Psychiatric/Behavioral:  Negative for dysphoric mood. The patient is not nervous/anxious.     Current Outpatient Medications on File Prior to Visit  Medication Sig Dispense Refill   atenolol  (TENORMIN ) 50 MG tablet Take 1 tablet by mouth twice  daily (Patient taking differently: Take 25 mg by mouth 2 (two) times daily.) 180 tablet 0   celecoxib  (CELEBREX ) 200 MG capsule Take 1 capsule (200 mg total) by mouth 2 (two) times daily for 28 days. 28 capsule 1   Cholecalciferol (VITAMIN D3) 50 MCG (2000 UT) capsule Take 2,000 Units by mouth daily.     Cyanocobalamin  (VITAMIN B 12 PO) Take 2,500 mcg by mouth daily.     furosemide  (LASIX ) 20 MG tablet Take 1 tablet by mouth once daily 90 tablet 1   KLOR-CON M20 20 MEQ tablet Take 10 mEq by mouth daily.     losartan  (COZAAR ) 25 MG tablet Take 1 tablet by mouth once daily 90 tablet 0   Multiple Vitamins-Minerals (LUTEIN-ZEAXANTHIN PO) Take 1 tablet by mouth daily.     rosuvastatin  (CRESTOR ) 5 MG tablet Take 1  tablet by mouth once daily 90 tablet 0   No current facility-administered medications on file prior to visit.   Past Medical History:  Diagnosis Date   Acute on chronic diastolic (congestive) heart failure (HCC)    Arthritis    Dyspnea    Essential (primary) hypertension    Essential hypertension, benign 11/21/2019   History of kidney stones    Hypertensive chronic kidney disease with stage 1 through stage 4 chronic kidney disease, or unspecified chronic kidney disease    Impaired fasting glucose    Localized edema    Memory loss 03/03/2021   Mixed hyperlipidemia    Morbid (severe) obesity due to excess calories (HCC)    Obesity, Class III, BMI 40-49.9 (morbid obesity) 11/23/2019   Other seasonal allergic rhinitis    Other specified nonscarring hair loss    Pain in left knee 01/21/2020   Pre-diabetes    Sleep apnea    Telogen effluvium    Past Surgical History:  Procedure Laterality Date   CHOLECYSTECTOMY  04/01/2022   Dr Margert   CYSTOSCOPY W/ URETERAL STENT PLACEMENT Left 04/01/2024   Procedure: CYSTOSCOPY, WITH RETROGRADE PYELOGRAM AND URETERAL STENT INSERTION;  Surgeon: Lovie Arlyss CROME, MD;  [facility];  Service: Urology;  Laterality: Left;   TONSILLECTOMY      Family History  Problem Relation Age of Onset   Transient ischemic attack Mother    Alzheimer's disease Mother    Stroke Father    Throat cancer Father    Breast cancer Sister 74   Pulmonary embolism Brother    Breast cancer Niece 44 - 34   Asthma Other    Diabetes type II Other    Hypertension Other    Hypothyroidism Other    Heart attack Other    Migraines Other    Osteoarthritis Other    Social History   Socioeconomic History   Marital status: Divorced    Spouse name: Not on file   Number of children: 3   Years of education: Not on file   Highest education level: Not on file  Occupational History   Occupation: Sports coach  Tobacco Use   Smoking status: Never   Smokeless tobacco:  Never  Vaping Use   Vaping status: Never Used  Substance and Sexual Activity   Alcohol use: Never   Drug use: Never   Sexual activity: Not Currently    Birth control/protection: Post-menopausal  Other Topics Concern   Not on file  Social History Narrative   3 daughters, 5 grandchildren, 5 great-grandchildren   Right handed   One story home, couple of steps at door  Drinks caffeine   Social Drivers of Corporate investment banker Strain: Low Risk  (01/25/2024)   Overall Financial Resource Strain (CARDIA)    Difficulty of Paying Living Expenses: Not hard at all  Food Insecurity: No Food Insecurity (01/25/2024)   Hunger Vital Sign    Worried About Running Out of Food in the Last Year: Never true    Ran Out of Food in the Last Year: Never true  Transportation Needs: No Transportation Needs (01/25/2024)   PRAPARE - Administrator, Civil Service (Medical): No    Lack of Transportation (Non-Medical): No  Physical Activity: Insufficiently Active (01/25/2024)   Exercise Vital Sign    Days of Exercise per Week: 3 days    Minutes of Exercise per Session: 30 min  Stress: No Stress Concern Present (01/25/2024)   [employer] of Occupational Health - Occupational Stress Questionnaire    Feeling of Stress : Not at all  Social Connections: Socially Isolated (01/25/2024)   Social Connection and Isolation Panel    Frequency of Communication with Friends and Family: Three times a week    Frequency of Social Gatherings with Friends and Family: Three times a week    Attends Religious Services: Never    Active Member of Clubs or Organizations: No    Attends Banker Meetings: Never    Marital Status: Divorced    Objective:  BP 120/72   Pulse 80   Temp (!) 97.3 F (36.3 C)   Resp 16   Ht 5' 2 (1.575 m)   Wt 224 lb (101.6 kg)   SpO2 97%   BMI 40.97 kg/m      04/19/2024    9:09 AM 04/15/2024    9:29 AM 04/01/2024    2:15 PM  BP/Weight  Systolic BP 120 185 140   Diastolic BP 72 74 88  Wt. (Lbs) 224 229.28   BMI 40.97 kg/m2 41.94 kg/m2     Physical Exam Vitals reviewed.  Constitutional:      Appearance: Normal appearance. She is obese.  Neck:     Vascular: No carotid bruit.  Cardiovascular:     Rate and Rhythm: Normal rate and regular rhythm.     Heart sounds: Normal heart sounds.  Pulmonary:     Effort: Pulmonary effort is normal. No respiratory distress.     Breath sounds: Normal breath sounds.  Abdominal:     General: Abdomen is flat. Bowel sounds are normal.     Palpations: Abdomen is soft.     Tenderness: There is no abdominal tenderness.  Neurological:     Mental Status: She is alert and oriented to person, place, and time.  Psychiatric:        Mood and Affect: Mood normal.        Behavior: Behavior normal.         Lab Results  Component Value Date   WBC 7.4 04/15/2024   HGB 12.2 04/15/2024   HCT 39.5 04/15/2024   PLT 172 04/15/2024   GLUCOSE 126 (H) 04/15/2024   CHOL 131 10/20/2023   TRIG 70 10/20/2023   HDL 61 10/20/2023   LDLCALC 56 10/20/2023   ALT 9 03/31/2024   AST 23 03/31/2024   NA 143 04/15/2024   K 5.1 04/15/2024   CL 106 04/15/2024   CREATININE 1.60 (H) 04/15/2024   BUN 37 (H) 04/15/2024   CO2 28 04/15/2024   TSH 3.290 05/26/2022   HGBA1C 5.8 (H) 10/20/2023  Assessment & Plan:  Hypertensive kidney disease with stage 3b chronic kidney disease (HCC) Assessment & Plan: Blood pressure is well controlled.  Managed with atenolol  50 mg 1/2 oral twice daily and losartan  25 mg daily.  Slightly reduced renal function, stable without rapid decline. Emphasized avoiding nephrotoxic medications and maintaining blood pressure control. Discussed avoiding NSAIDs, healthy diet, and regular physical activity. - Avoid NSAIDs (ibuprofen, naproxen) - Maintain blood pressure control     Mixed hyperlipidemia Assessment & Plan: Managed with Crestor  5 mg daily. Cholesterol levels are well controlled.  Emphasized maintaining cholesterol control for cardiovascular health. - Continue Crestor  5 mg daily   Impaired fasting glucose Assessment & Plan: Recommend continue to work on eating healthy diet and exercise.    Chronic diastolic congestive heart failure (HCC) Assessment & Plan: Stable.  Continue losartan , atenolol  and furosemide .    Morbid (severe) obesity due to excess calories [facility]) Assessment & Plan: Recommend continue to work on eating healthy diet and exercise.    Kidney stone on left side Assessment & Plan: Check labs.  Scheduled for cystoscopy/ureteroscopy/lithotripsy next week.     Follow-up: Return in about 4 months (around 08/19/2024) for chronic follow up.   I,Marla I Leal-Borjas,acting as a scribe for Abigail Free, MD.,have documented all relevant documentation on the behalf of Abigail Free, MD,as directed by  Abigail Free, MD while in the presence of Abigail Free, MD.   An After Visit Summary was printed and given to the patient.  I attest that I have reviewed this visit and agree with the plan scribed by my staff.  Abigail Free, MD [facility] [phone]

## 2024-04-20 DIAGNOSIS — N200 Calculus of kidney: Secondary | ICD-10-CM | POA: Insufficient documentation

## 2024-04-20 NOTE — Assessment & Plan Note (Addendum)
 Check labs.  Scheduled for cystoscopy/ureteroscopy/lithotripsy next week.

## 2024-04-20 NOTE — Assessment & Plan Note (Signed)
 Managed with Crestor  5 mg daily. Cholesterol levels are well controlled. Emphasized maintaining cholesterol control for cardiovascular health. - Continue Crestor  5 mg daily

## 2024-04-20 NOTE — Assessment & Plan Note (Signed)
 Recommend continue to work on eating healthy diet and exercise.

## 2024-04-20 NOTE — Assessment & Plan Note (Signed)
 Stable.  Continue losartan , atenolol  and furosemide .

## 2024-04-20 NOTE — Assessment & Plan Note (Signed)
 Blood pressure is well controlled.  Managed with atenolol  50 mg 1/2 oral twice daily and losartan  25 mg daily.  Slightly reduced renal function, stable without rapid decline. Emphasized avoiding nephrotoxic medications and maintaining blood pressure control. Discussed avoiding NSAIDs, healthy diet, and regular physical activity. - Avoid NSAIDs (ibuprofen, naproxen) - Maintain blood pressure control

## 2024-04-22 ENCOUNTER — Encounter (HOSPITAL_COMMUNITY): Payer: Self-pay

## 2024-04-22 NOTE — Anesthesia Preprocedure Evaluation (Signed)
 Anesthesia Evaluation  Patient identified by MRN, date of birth, ID band Patient awake    Reviewed: Allergy & Precautions, NPO status , Patient's Chart, lab work & pertinent test results  Airway Mallampati: II  TM Distance: >3 FB Neck ROM: Full    Dental  (+) Dental Advisory Given   Pulmonary sleep apnea    breath sounds clear to auscultation       Cardiovascular hypertension, Pt. on medications and Pt. on home beta blockers +CHF   Rhythm:Regular Rate:Normal     Neuro/Psych negative neurological ROS     GI/Hepatic negative GI ROS, Neg liver ROS,,,  Endo/Other    Class 3 obesity  Renal/GU CRFRenal disease     Musculoskeletal  (+) Arthritis ,    Abdominal   Peds  Hematology negative hematology ROS (+)   Anesthesia Other Findings   Reproductive/Obstetrics                              Anesthesia Physical Anesthesia Plan  ASA: 3  Anesthesia Plan: General   Post-op Pain Management: Ofirmev  IV (intra-op)* and Minimal or no pain anticipated   Induction: Intravenous  PONV Risk Score and Plan: 3 and Dexamethasone , Ondansetron  and Treatment may vary due to age or medical condition  Airway Management Planned: LMA  Additional Equipment: None  Intra-op Plan:   Post-operative Plan: Extubation in OR  Informed Consent: I have reviewed the patients History and Physical, chart, labs and discussed the procedure including the risks, benefits and alternatives for the proposed anesthesia with the patient or authorized representative who has indicated his/her understanding and acceptance.     Dental advisory given  Plan Discussed with: CRNA  Anesthesia Plan Comments:          Anesthesia Quick Evaluation

## 2024-04-22 NOTE — Anesthesia Preprocedure Evaluation (Addendum)
 Anesthesia Evaluation  Patient identified by MRN, date of birth, ID band Patient awake    Reviewed: Allergy & Precautions, NPO status , Patient's Chart, lab work & pertinent test results  Airway Mallampati: II  TM Distance: >3 FB Neck ROM: Full    Dental  (+) Dental Advisory Given   Pulmonary sleep apnea    breath sounds clear to auscultation       Cardiovascular hypertension, Pt. on medications and Pt. on home beta blockers +CHF   Rhythm:Regular Rate:Normal     Neuro/Psych negative neurological ROS     GI/Hepatic negative GI ROS, Neg liver ROS,,,  Endo/Other    Class 3 obesity  Renal/GU CRFRenal disease     Musculoskeletal  (+) Arthritis ,    Abdominal   Peds  Hematology negative hematology ROS (+)   Anesthesia Other Findings   Reproductive/Obstetrics                              Anesthesia Physical Anesthesia Plan  ASA: 3  Anesthesia Plan: General   Post-op Pain Management: Ofirmev  IV (intra-op)* and Minimal or no pain anticipated   Induction: Intravenous  PONV Risk Score and Plan: 3 and Dexamethasone , Ondansetron  and Treatment may vary due to age or medical condition  Airway Management Planned: LMA  Additional Equipment: None  Intra-op Plan:   Post-operative Plan: Extubation in OR  Informed Consent: I have reviewed the patients History and Physical, chart, labs and discussed the procedure including the risks, benefits and alternatives for the proposed anesthesia with the patient or authorized representative who has indicated his/her understanding and acceptance.     Dental advisory given  Plan Discussed with: CRNA  Anesthesia Plan Comments:          Anesthesia Quick Evaluation

## 2024-04-23 ENCOUNTER — Ambulatory Visit (HOSPITAL_COMMUNITY): Payer: Self-pay | Admitting: Medical

## 2024-04-23 ENCOUNTER — Ambulatory Visit (HOSPITAL_BASED_OUTPATIENT_CLINIC_OR_DEPARTMENT_OTHER): Admitting: Anesthesiology

## 2024-04-23 ENCOUNTER — Encounter (HOSPITAL_COMMUNITY): Payer: Self-pay | Admitting: Urology

## 2024-04-23 ENCOUNTER — Ambulatory Visit (HOSPITAL_COMMUNITY)
Admission: RE | Admit: 2024-04-23 | Discharge: 2024-04-23 | Disposition: A | Discharge status: Home / Self Care | Source: Ambulatory Visit | Attending: Urology | Admitting: Urology

## 2024-04-23 ENCOUNTER — Ambulatory Visit (HOSPITAL_COMMUNITY)

## 2024-04-23 ENCOUNTER — Encounter (HOSPITAL_COMMUNITY)
Admission: RE | Disposition: A | Discharge status: Home / Self Care | Payer: Self-pay | Source: Ambulatory Visit | Attending: Urology

## 2024-04-23 DIAGNOSIS — I5032 Chronic diastolic (congestive) heart failure: Secondary | ICD-10-CM | POA: Diagnosis not present

## 2024-04-23 DIAGNOSIS — I130 Hypertensive heart and chronic kidney disease with heart failure and stage 1 through stage 4 chronic kidney disease, or unspecified chronic kidney disease: Secondary | ICD-10-CM | POA: Insufficient documentation

## 2024-04-23 DIAGNOSIS — N1832 Chronic kidney disease, stage 3b: Secondary | ICD-10-CM | POA: Diagnosis not present

## 2024-04-23 DIAGNOSIS — M1990 Unspecified osteoarthritis, unspecified site: Secondary | ICD-10-CM | POA: Diagnosis not present

## 2024-04-23 DIAGNOSIS — N201 Calculus of ureter: Secondary | ICD-10-CM | POA: Diagnosis not present

## 2024-04-23 DIAGNOSIS — G4730 Sleep apnea, unspecified: Secondary | ICD-10-CM | POA: Insufficient documentation

## 2024-04-23 DIAGNOSIS — Z01818 Encounter for other preprocedural examination: Secondary | ICD-10-CM

## 2024-04-23 DIAGNOSIS — Z6841 Body Mass Index (BMI) 40.0 and over, adult: Secondary | ICD-10-CM | POA: Insufficient documentation

## 2024-04-23 DIAGNOSIS — N200 Calculus of kidney: Secondary | ICD-10-CM

## 2024-04-23 DIAGNOSIS — I5033 Acute on chronic diastolic (congestive) heart failure: Secondary | ICD-10-CM | POA: Insufficient documentation

## 2024-04-23 DIAGNOSIS — Z79899 Other long term (current) drug therapy: Secondary | ICD-10-CM | POA: Diagnosis not present

## 2024-04-23 DIAGNOSIS — E66813 Obesity, class 3: Secondary | ICD-10-CM | POA: Diagnosis not present

## 2024-04-23 DIAGNOSIS — N189 Chronic kidney disease, unspecified: Secondary | ICD-10-CM | POA: Diagnosis not present

## 2024-04-23 HISTORY — PX: CYSTOSCOPY/URETEROSCOPY/HOLMIUM LASER/STENT PLACEMENT: SHX6546

## 2024-04-23 LAB — GLUCOSE, CAPILLARY: Glucose-Capillary: 159 mg/dL — ABNORMAL HIGH (ref 70–99)

## 2024-04-23 SURGERY — CYSTOSCOPY/URETEROSCOPY/HOLMIUM LASER/STENT PLACEMENT
Anesthesia: General | Site: Ureter | Laterality: Left

## 2024-04-23 MED ORDER — FENTANYL CITRATE (PF) 100 MCG/2ML IJ SOLN
INTRAMUSCULAR | Status: DC | PRN
  Administered 2024-04-23 (×4): 25 ug via INTRAVENOUS

## 2024-04-23 MED ORDER — OXYCODONE HCL 5 MG PO TABS: 5.0000 mg | ORAL_TABLET | Freq: Three times a day (TID) | ORAL | 0 refills | Status: DC

## 2024-04-23 MED ORDER — LIDOCAINE HCL (CARDIAC) PF 100 MG/5ML IV SOSY
PREFILLED_SYRINGE | INTRAVENOUS | Status: DC | PRN
  Administered 2024-04-23 (×2): 60 mg via INTRAVENOUS

## 2024-04-23 MED ORDER — EPHEDRINE 5 MG/ML INJ
INTRAVENOUS | Status: AC
Start: 2024-04-23 — End: 2024-04-23
  Filled 2024-04-23: qty 5

## 2024-04-23 MED ORDER — EPHEDRINE SULFATE-NACL 50-0.9 MG/10ML-% IV SOSY
PREFILLED_SYRINGE | INTRAVENOUS | Status: DC | PRN
  Administered 2024-04-23 (×6): 5 mg via INTRAVENOUS

## 2024-04-23 MED ORDER — DEXAMETHASONE SODIUM PHOSPHATE 10 MG/ML IJ SOLN
INTRAMUSCULAR | Status: AC
  Filled 2024-04-23: qty 1

## 2024-04-23 MED ORDER — PROPOFOL 10 MG/ML IV BOLUS
INTRAVENOUS | Status: DC | PRN
Start: 2024-04-23 — End: 2024-04-23
  Administered 2024-04-23 (×2): 120 mg via INTRAVENOUS

## 2024-04-23 MED ORDER — DEXAMETHASONE SODIUM PHOSPHATE 10 MG/ML IJ SOLN
INTRAMUSCULAR | Status: DC | PRN
  Administered 2024-04-23 (×2): 10 mg via INTRAVENOUS

## 2024-04-23 MED ORDER — IOHEXOL 300 MG/ML  SOLN
INTRAMUSCULAR | Status: DC | PRN
  Administered 2024-04-23 (×2): 6 mL

## 2024-04-23 MED ORDER — ACETAMINOPHEN 10 MG/ML IV SOLN
INTRAVENOUS | Status: AC
  Filled 2024-04-23: qty 100

## 2024-04-23 MED ORDER — CIPROFLOXACIN IN D5W 400 MG/200ML IV SOLN
400.0000 mg | INTRAVENOUS | Status: AC
  Administered 2024-04-23 (×2): 400 mg via INTRAVENOUS
  Filled 2024-04-23: qty 200

## 2024-04-23 MED ORDER — LABETALOL HCL 5 MG/ML IV SOLN
10.0000 mg | INTRAVENOUS | Status: DC | PRN
  Administered 2024-04-23 (×2): 10 mg via INTRAVENOUS

## 2024-04-23 MED ORDER — PROPOFOL 10 MG/ML IV BOLUS
INTRAVENOUS | Status: AC
Start: 2024-04-23 — End: 2024-04-23
  Filled 2024-04-23: qty 20

## 2024-04-23 MED ORDER — LIDOCAINE HCL (PF) 2 % IJ SOLN
INTRAMUSCULAR | Status: AC
  Filled 2024-04-23: qty 5

## 2024-04-23 MED ORDER — LACTATED RINGERS IV SOLN: INTRAVENOUS | Status: DC

## 2024-04-23 MED ORDER — CHLORHEXIDINE GLUCONATE 0.12 % MT SOLN
15.0000 mL | Freq: Once | OROMUCOSAL | Status: AC
  Administered 2024-04-23 (×2): 15 mL via OROMUCOSAL

## 2024-04-23 MED ORDER — FENTANYL CITRATE PF 50 MCG/ML IJ SOSY: 25.0000 ug | PREFILLED_SYRINGE | INTRAMUSCULAR | Status: DC | PRN

## 2024-04-23 MED ORDER — AMISULPRIDE (ANTIEMETIC) 5 MG/2ML IV SOLN: 10.0000 mg | Freq: Once | INTRAVENOUS | Status: DC | PRN

## 2024-04-23 MED ORDER — FENTANYL CITRATE (PF) 100 MCG/2ML IJ SOLN
INTRAMUSCULAR | Status: AC
  Filled 2024-04-23: qty 2

## 2024-04-23 MED ORDER — HYOSCYAMINE SULFATE SL 0.125 MG SL SUBL: 1.0000 | SUBLINGUAL_TABLET | Freq: Four times a day (QID) | SUBLINGUAL | 0 refills | Status: AC | PRN

## 2024-04-23 MED ORDER — ONDANSETRON HCL 4 MG/2ML IJ SOLN
INTRAMUSCULAR | Status: AC
  Filled 2024-04-23: qty 2

## 2024-04-23 MED ORDER — ONDANSETRON HCL 4 MG/2ML IJ SOLN
INTRAMUSCULAR | Status: DC | PRN
  Administered 2024-04-23 (×2): 4 mg via INTRAVENOUS

## 2024-04-23 MED ORDER — NITROFURANTOIN MONOHYD MACRO 100 MG PO CAPS: 100.0000 mg | ORAL_CAPSULE | Freq: Two times a day (BID) | ORAL | 0 refills | Status: AC

## 2024-04-23 MED ORDER — LABETALOL HCL 5 MG/ML IV SOLN
INTRAVENOUS | Status: AC
  Filled 2024-04-23: qty 4

## 2024-04-23 MED ORDER — ACETAMINOPHEN 10 MG/ML IV SOLN
INTRAVENOUS | Status: DC | PRN
  Administered 2024-04-23 (×2): 1000 mg via INTRAVENOUS

## 2024-04-23 MED ORDER — SODIUM CHLORIDE 0.9 % IR SOLN
Status: DC | PRN
  Administered 2024-04-23 (×2): 3000 mL

## 2024-04-23 MED ORDER — ORAL CARE MOUTH RINSE: 15.0000 mL | Freq: Once | OROMUCOSAL | Status: AC

## 2024-04-23 SURGICAL SUPPLY — 20 items
BAG COUNTER SPONGE SURGICOUNT (BAG) IMPLANT
BAG URO CATCHER STRL LF (MISCELLANEOUS) ×1 IMPLANT
BASKET ZERO TIP NITINOL 2.4FR (BASKET) IMPLANT
CATH URETERAL DUAL LUMEN 10F (MISCELLANEOUS) ×1 IMPLANT
CATH URETL OPEN END 6FR 70 (CATHETERS) IMPLANT
CLOTH BEACON ORANGE TIMEOUT ST (SAFETY) ×1 IMPLANT
GLOVE SS BIOGEL STRL SZ 7 (GLOVE) ×1 IMPLANT
GOWN STRL REUS W/ TWL XL LVL3 (GOWN DISPOSABLE) ×1 IMPLANT
GUIDEWIRE STR DUAL SENSOR (WIRE) IMPLANT
GUIDEWIRE ZIPWRE .038 STRAIGHT (WIRE) ×1 IMPLANT
IV NS 1000ML BAXH (IV SOLUTION) ×1 IMPLANT
KIT TURNOVER KIT A (KITS) ×1 IMPLANT
MANIFOLD NEPTUNE II (INSTRUMENTS) ×1 IMPLANT
PACK CYSTO (CUSTOM PROCEDURE TRAY) ×1 IMPLANT
SHEATH NAVIGATOR HD 11/13X36 (SHEATH) IMPLANT
STENT URET 6FRX24 CONTOUR (STENTS) IMPLANT
TRACTIP FLEXIVA PULS ID 200XHI (Laser) IMPLANT
TRACTIP FLEXIVA PULSE ID 200 (Laser) IMPLANT
TUBING CONNECTING 10 (TUBING) ×1 IMPLANT
TUBING UROLOGY SET (TUBING) ×1 IMPLANT

## 2024-04-23 NOTE — Transfer of Care (Signed)
 Immediate Anesthesia Transfer of Care Note  Patient: Lisa Ortega  Procedure(s) Performed: CYSTOSCOPY/URETEROSCOPY/HOLMIUM LASER/STENT PLACEMENT (Left: Ureter)  Patient Location: PACU  Anesthesia Type:General  Level of Consciousness: awake and sedated  Airway & Oxygen Therapy: Patient Spontanous Breathing and Patient connected to face mask oxygen  Post-op Assessment: Report given to RN and Post -op Vital signs reviewed and stable  Post vital signs: Reviewed and stable  Last Vitals:  Vitals Value Taken Time  BP 152/59 04/23/24 11:05  Temp    Pulse 67 04/23/24 11:07  Resp 14 04/23/24 11:07  SpO2 100 % 04/23/24 11:07  Vitals shown include unfiled device data.  Last Pain:  Vitals:   04/23/24 0744  TempSrc: Oral  PainSc: 0-No pain      Patients Stated Pain Goal: 4 (04/23/24 0744)  Complications: No notable events documented.

## 2024-04-23 NOTE — Op Note (Signed)
 Operative Note  Preoperative diagnosis:  1.  Left renal stone  Postoperative diagnosis: 1.  same  Procedure(s): 1.  Left ureteroscopy with laser lithotripsy and dusting of stones 2. Left retrograde pyelogram 3. Left ureteral stent placement  Surgeon: Herlene Foot, MD  Assistants:  None  Anesthesia:  General  Complications:  None  EBL: Minimal  Specimens: 1. none  Drains/Catheters: 1.  Left 6Fr x 24cm ureteral stent with tether string  Intraoperative findings:   Cystoscopy demonstrated unremarkable bladder Left Ureteroscopy demonstrated large stone in left collecting system. This was dusted completely Successful stent placement.  Left Retrograde Pyelogram: no hydronephrosis. Large filling defect in collecting system consistent with stone. No other filling defects    Description of procedure: After informed consent was obtained from the patient, the patient was identified and taken to the operating room and placed in the supine position. In preop, the higher risk of needing an additional procedure due to the size of the stone was discussed - either staged or a residual obstructing fragment.  General anesthesia was administered as well as perioperative IV antibiotics.  At the beginning of the case, a time-out was performed to properly identify the patient, the surgery to be performed, and the surgical site.  Sequential compression devices were applied to the lower extremities at the beginning of the case for DVT prophylaxis.  The patient was then placed in the dorsal lithotomy supine position, prepped and draped in sterile fashion.  We then passed the 21-French rigid cystoscope through the urethra and into the bladder under vision without any difficulty.  A systematic evaluation of the bladder revealed no evidence of any suspicious bladder lesions.  Ureteral orifices were in normal position.    The distal aspect of the ureteral stent was seen protruding from the leftureteral  orifice.  We then used the alligator-tooth forceps and grasped the distal end of the ureteral stent and brought it out the urethral meatus while watching the proximal coil straighten out nicely on fluoroscopy. Through the ureteral stent, we then passed a 0.038 glide wire up to the level of the renal pelvis.  The ureteral stent was then removed, leaving the glide wire up the left ureter.  The cystoscope was withdrawn, and a dual lumen catheter was inserted over the glide wire into the distal ureter. A gentle retrograde pyelogram was performed. There was no hydronephrosis of the collecting system. There was a filling defect in the collecting system corresponding to the stone. A 0.038 sensor wire was then passed up to the level of the renal pelvis and secured to the drape as a safety wire. The dual lumen was removed.  An 11/13Fr ureteral access sheath was carefully advanced up the ureter to the level of the UPJ over this wire under fluoroscopic guidance. The flexible ureteroscope was advanced into the collecting system via the sheath. The collecting system was inspected. The calculus was identified. Using the 242 micron holmium laser fiber, the stone was dusted completely. With the ureteroscope in the kidney, a gentle pyelogram was performed to delineate the calyceal system and we evaluated the calyces systematically. We encountered no further stones >3 mm. The rest of the stone fragments were very tiny and these were  irrigated away gently. The calyces were re-inspected and there were no significant stone fragment residual.   We then withdrew the ureteroscope back down the ureter along with the access sheath, noting no evidence of any stones along the course of the ureter.  Prior to removing the  ureteroscope, we did pass the Glidewire back up to the ureter to the renal pelvis.  Once the ureteroscope was removed, we then used the Glidewire under fluoroscopic guidance and passed up a 6-French x 24 cm double-pigtail  ureteral stent up the ureter, making sure that the proximal and distal ends coiled within the kidney and bladder respectively.   Note that we left a tether string attached to the distal end of the ureteral stent and it exited the urethral meatus with a short tether.  The cystoscope was then advanced back into the bladder under vision.  We were able to see the distal stent coiling nicely within the bladder.  The bladder was then emptied with irrigation solution.  The cystoscope was then removed.    The patient tolerated the procedure well and there was no complication. Patient was awoken from anesthesia and taken to the recovery room in stable condition. I was present and scrubbed for the entirety of the case.  Plan:  Patient will be discharged home and may remove stent in 6 days   G. Herlene Foot MD [facility]  Pager: [phone]

## 2024-04-23 NOTE — Progress Notes (Signed)
 Pt states she has sleep apnea but does not wear cpap.

## 2024-04-23 NOTE — Anesthesia Procedure Notes (Signed)
 Procedure Name: LMA Insertion Date/Time: 04/23/2024 9:49 AM  Performed by: Therisa Doyal CROME, CRNAPatient Re-evaluated:Patient Re-evaluated prior to induction Oxygen Delivery Method: Circle system utilized Preoxygenation: Pre-oxygenation with 100% oxygen Induction Type: IV induction LMA: LMA inserted LMA Size: 4.0 Number of attempts: 1 Placement Confirmation: positive ETCO2 and breath sounds checked- equal and bilateral Tube secured with: Tape Dental Injury: Teeth and Oropharynx as per pre-operative assessment

## 2024-04-23 NOTE — Anesthesia Postprocedure Evaluation (Signed)
 Anesthesia Post Note  Patient: Production assistant, radio  Procedure(s) Performed: CYSTOSCOPY/URETEROSCOPY/HOLMIUM LASER/STENT PLACEMENT (Left: Ureter)     Patient location during evaluation: PACU Anesthesia Type: General Level of consciousness: awake and alert Pain management: pain level controlled Vital Signs Assessment: post-procedure vital signs reviewed and stable Respiratory status: spontaneous breathing, nonlabored ventilation, respiratory function stable and patient connected to nasal cannula oxygen Cardiovascular status: blood pressure returned to baseline and stable Postop Assessment: no apparent nausea or vomiting Anesthetic complications: no   No notable events documented.  Last Vitals:  Vitals:   04/23/24 1214 04/23/24 1220  BP: (!) 181/65 (!) 172/64  Pulse: 67 72  Resp: 11 13  Temp: (!) 36.1 C 36.7 C  SpO2: 99% 95%    Last Pain:  Vitals:   04/23/24 1220  TempSrc: Oral  PainSc: 0-No pain                 Epifanio Lamar BRAVO

## 2024-04-23 NOTE — H&P (Signed)
 H&P  History of Present Illness: Lisa Ortega is a 78 y.o. year old F who presents today for treatment of a proximal left stone  No acute complaints  Past Medical History:  Diagnosis Date   Acute on chronic diastolic (congestive) heart failure (HCC)    Arthritis    Dyspnea    Essential (primary) hypertension    History of kidney stones    Hypertensive chronic kidney disease with stage 1 through stage 4 chronic kidney disease, or unspecified chronic kidney disease    Localized edema    Memory loss 03/03/2021   Mixed hyperlipidemia    Morbid (severe) obesity due to excess calories (HCC)    Obesity, Class III, BMI 40-49.9 (morbid obesity) 11/23/2019   Other seasonal allergic rhinitis    Other specified nonscarring hair loss    Pain in left knee 01/21/2020   Pre-diabetes    Sleep apnea    Telogen effluvium     Past Surgical History:  Procedure Laterality Date   CHOLECYSTECTOMY  04/01/2022   Dr Margert   CYSTOSCOPY W/ URETERAL STENT PLACEMENT Left 04/01/2024   Procedure: CYSTOSCOPY, WITH RETROGRADE PYELOGRAM AND URETERAL STENT INSERTION;  Surgeon: Lovie Arlyss CROME, MD;  Location: [facility];  Service: Urology;  Laterality: Left;   TONSILLECTOMY      Home Medications:  Current Meds  Medication Sig   atenolol  (TENORMIN ) 50 MG tablet Take 1 tablet by mouth twice daily (Patient taking differently: Take 25 mg by mouth 2 (two) times daily.)   celecoxib  (CELEBREX ) 200 MG capsule Take 1 capsule (200 mg total) by mouth 2 (two) times daily for 28 days.   Cholecalciferol (VITAMIN D3) 50 MCG (2000 UT) capsule Take 2,000 Units by mouth daily.   Cyanocobalamin  (VITAMIN B 12 PO) Take 2,500 mcg by mouth daily.   furosemide  (LASIX ) 20 MG tablet Take 1 tablet by mouth once daily   KLOR-CON M20 20 MEQ tablet Take 10 mEq by mouth daily.   losartan  (COZAAR ) 25 MG tablet Take 1 tablet by mouth once daily   Multiple Vitamins-Minerals (LUTEIN-ZEAXANTHIN PO) Take 1 tablet by mouth daily.    rosuvastatin  (CRESTOR ) 5 MG tablet Take 1 tablet by mouth once daily   [DISCONTINUED] tamsulosin  (FLOMAX ) 0.4 MG CAPS capsule Take 1 capsule (0.4 mg total) by mouth daily after supper.    Allergies:  Allergies  Allergen Reactions   Amlodipine Other (See Comments)    Alopecia   Lisinopril-Hydrochlorothiazide     Family History  Problem Relation Age of Onset   Transient ischemic attack Mother    Alzheimer's disease Mother    Stroke Father    Throat cancer Father    Breast cancer Sister 27   Pulmonary embolism Brother    Breast cancer Niece 27 - 64   Asthma Other    Diabetes type II Other    Hypertension Other    Hypothyroidism Other    Heart attack Other    Migraines Other    Osteoarthritis Other     Social History:  reports that she has never smoked. She has never used smokeless tobacco. She reports that she does not drink alcohol and does not use drugs.  ROS: A complete review of systems was performed.  All systems are negative except for pertinent findings as noted.  Physical Exam:  Vital signs in last 24 hours: Temp:  [98.1 F (36.7 C)] 98.1 F (36.7 C) (08/12 0744) Pulse Rate:  [79] 79 (08/12 0744) Resp:  [16] 16 (08/12 0744)  BP: (173)/(63) 173/63 (08/12 0744) SpO2:  [97 %] 97 % (08/12 0744) Constitutional:  Alert and oriented, No acute distress Cardiovascular: Regular rate and rhythm Respiratory: Normal respiratory effort, Lungs clear bilaterally GI: Abdomen is soft, nontender, nondistended, no abdominal masses Lymphatic: No lymphadenopathy Neurologic: Grossly intact, no focal deficits Psychiatric: Normal mood and affect   Laboratory Data:  No results for input(s): WBC, HGB, HCT, PLT in the last 72 hours.  No results for input(s): NA, K, CL, GLUCOSE, BUN, CALCIUM , CREATININE in the last 72 hours.  Invalid input(s): CO3   No results found for this or any previous visit (from the past 24 hours). No results found for this or any  previous visit (from the past 240 hours).  Renal Function: No results for input(s): CREATININE in the last 168 hours. Estimated Creatinine Clearance: 31.8 mL/min (A) (by C-G formula based on SCr of 1.6 mg/dL (H)).  Radiologic Imaging: No results found.  Assessment:  Lisa Ortega is a 78 y.o. year old F with large proximal left ureteral stone   Plan:  --to OR as planned for left ureteroscopy with laser litho, stent. Procedure and risks reviewed, including but not limited to hematuria, infection, sepsis, damage to GU tract, failure to complete procedure, retained stone fragments, need for future procedures, stent pain, prolonged stent.   Herlene Foot, MD 04/23/2024, 9:20 AM  [facility] Pager: [phone]

## 2024-04-23 NOTE — Discharge Instructions (Signed)
 [facility] [phone] Post Ureteroscopy With or Without Stent Instructions **remove stent monday  Definitions:  Ureter: The duct that transports urine from the kidney to the bladder. Stent:   A plastic hollow tube that is placed into the ureter, from the kidney to the bladder to prevent the ureter from swelling shut.  GENERAL INSTRUCTIONS:  Despite the fact that no skin incisions were used, the area around the ureter and bladder is raw and irritated. The stent is a foreign body which will further irritate the bladder wall. This irritation is manifested by increased frequency of urination, both day and night, and by an increase in the urge to urinate. In some, the urge to urinate is present almost always. Sometimes the urge is strong enough that you may not be able to stop yourself from urinating. The only real cure is to remove the stent and then give time for the bladder wall to heal which can't be done until the danger of the ureter swelling shut has passed, which varies.  You may see some blood in your urine while the stent is in place and a few days afterwards. Do not be alarmed, even if the urine was clear for a while. Get off your feet and drink lots of fluids until clearing occurs. If you start to pass clots or don't improve, call us .  DIET: You may return to your normal diet immediately. Because of the raw surface of your bladder, alcohol, spicy foods, acid type foods and drinks with caffeine may cause irritation or frequency and should be used in moderation. To keep your urine flowing freely and to avoid constipation, drink plenty of fluids during the day ( 8-10 glasses ). Tip: Avoid cranberry juice because it is very acidic.  ACTIVITY: Your physical activity doesn't need to be restricted. However, if you are very active, you may see some blood in your urine. We suggest that you reduce your activity under these circumstances until the bleeding has  stopped.  BOWELS: It is important to keep your bowels regular during the postoperative period. Straining with bowel movements can cause bleeding. A bowel movement every other day is reasonable. Use a mild laxative if needed, such as Milk of Magnesia 2-3 tablespoons, or 2 Dulcolax tablets. Call if you continue to have problems. If you have been taking narcotics for pain, before, during or after your surgery, you may be constipated. Take a laxative if necessary.   MEDICATION: You should resume your pre-surgery medications unless told not to. In addition you will often be given an antibiotic to prevent infection and likely several as needed medications for stent related discomfort. These should be taken as prescribed until the bottles are finished unless you are having an unusual reaction to one of the drugs.  PROBLEMS YOU SHOULD REPORT TO US : Fevers over 100.5 Fahrenheit. Heavy bleeding, or clots ( See above notes about blood in urine ). Inability to urinate. Drug reactions ( hives, rash, nausea, vomiting, diarrhea ). Severe burning or pain with urination that is not improving.

## 2024-04-24 ENCOUNTER — Encounter (HOSPITAL_COMMUNITY): Payer: Self-pay | Admitting: Urology

## 2024-05-07 ENCOUNTER — Other Ambulatory Visit: Payer: Self-pay | Admitting: Family Medicine

## 2024-05-23 DIAGNOSIS — N1831 Chronic kidney disease, stage 3a: Secondary | ICD-10-CM | POA: Diagnosis not present

## 2024-05-30 DIAGNOSIS — D631 Anemia in chronic kidney disease: Secondary | ICD-10-CM | POA: Diagnosis not present

## 2024-05-30 DIAGNOSIS — E559 Vitamin D deficiency, unspecified: Secondary | ICD-10-CM | POA: Diagnosis not present

## 2024-05-30 DIAGNOSIS — N1831 Chronic kidney disease, stage 3a: Secondary | ICD-10-CM | POA: Diagnosis not present

## 2024-06-03 DIAGNOSIS — N202 Calculus of kidney with calculus of ureter: Secondary | ICD-10-CM | POA: Diagnosis not present

## 2024-06-03 DIAGNOSIS — R3121 Asymptomatic microscopic hematuria: Secondary | ICD-10-CM | POA: Diagnosis not present

## 2024-06-17 DIAGNOSIS — K080 Exfoliation of teeth due to systemic causes: Secondary | ICD-10-CM | POA: Diagnosis not present

## 2024-06-24 ENCOUNTER — Other Ambulatory Visit: Payer: Self-pay | Admitting: Family Medicine

## 2024-06-24 DIAGNOSIS — E782 Mixed hyperlipidemia: Secondary | ICD-10-CM

## 2024-08-02 ENCOUNTER — Other Ambulatory Visit: Payer: Self-pay | Admitting: Family Medicine

## 2024-08-19 ENCOUNTER — Ambulatory Visit: Admitting: Family Medicine

## 2024-08-19 ENCOUNTER — Encounter: Payer: Self-pay | Admitting: Family Medicine

## 2024-08-19 VITALS — BP 118/86 | HR 75 | Temp 97.8°F | Ht 62.0 in | Wt 222.0 lb

## 2024-08-19 DIAGNOSIS — J410 Simple chronic bronchitis: Secondary | ICD-10-CM | POA: Diagnosis not present

## 2024-08-19 DIAGNOSIS — R7301 Impaired fasting glucose: Secondary | ICD-10-CM | POA: Diagnosis not present

## 2024-08-19 DIAGNOSIS — E782 Mixed hyperlipidemia: Secondary | ICD-10-CM | POA: Diagnosis not present

## 2024-08-19 DIAGNOSIS — N1832 Chronic kidney disease, stage 3b: Secondary | ICD-10-CM | POA: Diagnosis not present

## 2024-08-19 DIAGNOSIS — L659 Nonscarring hair loss, unspecified: Secondary | ICD-10-CM

## 2024-08-19 DIAGNOSIS — I129 Hypertensive chronic kidney disease with stage 1 through stage 4 chronic kidney disease, or unspecified chronic kidney disease: Secondary | ICD-10-CM | POA: Diagnosis not present

## 2024-08-19 LAB — COMPREHENSIVE METABOLIC PANEL WITH GFR
ALT: 9 IU/L (ref 0–32)
AST: 18 IU/L (ref 0–40)
Albumin: 4.2 g/dL (ref 3.8–4.8)
Alkaline Phosphatase: 103 IU/L (ref 49–135)
BUN/Creatinine Ratio: 20 (ref 12–28)
BUN: 29 mg/dL — ABNORMAL HIGH (ref 8–27)
Bilirubin Total: 0.6 mg/dL (ref 0.0–1.2)
CO2: 27 mmol/L (ref 20–29)
Calcium: 9.6 mg/dL (ref 8.7–10.3)
Chloride: 104 mmol/L (ref 96–106)
Creatinine, Ser: 1.44 mg/dL — ABNORMAL HIGH (ref 0.57–1.00)
Globulin, Total: 3.2 g/dL (ref 1.5–4.5)
Glucose: 134 mg/dL — ABNORMAL HIGH (ref 70–99)
Potassium: 5.1 mmol/L (ref 3.5–5.2)
Sodium: 146 mmol/L — ABNORMAL HIGH (ref 134–144)
Total Protein: 7.4 g/dL (ref 6.0–8.5)
eGFR: 37 mL/min/1.73 — ABNORMAL LOW (ref >59)

## 2024-08-19 LAB — CBC WITH DIFFERENTIAL/PLATELET
Basophils Absolute: 0 x10E3/uL (ref 0.0–0.2)
Basos: 0 %
EOS (ABSOLUTE): 0.1 x10E3/uL (ref 0.0–0.4)
Eos: 1 %
Hematocrit: 39.6 % (ref 34.0–46.6)
Hemoglobin: 12.5 g/dL (ref 11.1–15.9)
Immature Grans (Abs): 0 x10E3/uL (ref 0.0–0.1)
Immature Granulocytes: 0 %
Lymphocytes Absolute: 2.1 x10E3/uL (ref 0.7–3.1)
Lymphs: 22 %
MCH: 28.9 pg (ref 26.6–33.0)
MCHC: 31.6 g/dL (ref 31.5–35.7)
MCV: 92 fL (ref 79–97)
Monocytes Absolute: 0.7 x10E3/uL (ref 0.1–0.9)
Monocytes: 7 %
Neutrophils Absolute: 6.8 x10E3/uL (ref 1.4–7.0)
Neutrophils: 70 %
Platelets: 215 x10E3/uL (ref 150–450)
RBC: 4.33 x10E6/uL (ref 3.77–5.28)
RDW: 12.4 % (ref 11.7–15.4)
WBC: 9.7 x10E3/uL (ref 3.4–10.8)

## 2024-08-19 LAB — POCT GLYCOSYLATED HEMOGLOBIN (HGB A1C): HbA1c POC (<> result, manual entry): 6.1 % (ref 4.0–5.6)

## 2024-08-19 LAB — LIPID PANEL
Chol/HDL Ratio: 2.3 ratio (ref 0.0–4.4)
Cholesterol, Total: 125 mg/dL (ref 100–199)
HDL: 55 mg/dL (ref >39)
LDL Chol Calc (NIH): 57 mg/dL (ref 0–99)
Triglycerides: 61 mg/dL (ref 0–149)
VLDL Cholesterol Cal: 13 mg/dL (ref 5–40)

## 2024-08-19 MED ORDER — ROSUVASTATIN CALCIUM 5 MG PO TABS: 5.0000 mg | ORAL_TABLET | Freq: Every day | ORAL | 1 refills | Status: AC

## 2024-08-19 MED ORDER — LOSARTAN POTASSIUM 25 MG PO TABS: 25.0000 mg | ORAL_TABLET | Freq: Every day | ORAL | 1 refills | Status: AC

## 2024-08-19 MED ORDER — ATENOLOL 50 MG PO TABS: 50.0000 mg | ORAL_TABLET | Freq: Two times a day (BID) | ORAL | 1 refills | Status: AC

## 2024-08-19 NOTE — Assessment & Plan Note (Addendum)
 Orders:    Lipid panel

## 2024-08-19 NOTE — Patient Instructions (Addendum)
  VISIT SUMMARY: Today, you had a routine follow-up visit to discuss your ongoing health concerns and management plans.  YOUR PLAN: HYPERTENSIVE CHRONIC KIDNEY DISEASE: You have chronic kidney disease with high blood pressure, which is well-controlled with your current medications. -We checked your kidney function today. -Continue taking losartan  25 mg once daily. -Continue taking atenolol  50 mg twice daily. -Continue taking Lasix  20 mg once daily.  IMPAIRED FASTING GLUCOSE (PREDIABETES): Your recent A1c level is slightly elevated, indicating prediabetes. -Make dietary changes to reduce sugar intake, such as eating fewer bananas. -Increase your intake of green vegetables and salads. -Engage in regular physical activity, such as walking.  MIXED HYPERLIPIDEMIA: You have high cholesterol levels, which are being managed with medication. -Continue taking rosuvastatin  5 mg once daily.  ALOPECIA: You have a history of hair loss and have tried medications without improvement. -You have seen the dermatologist and I would recommend you return for follow up and to have a full SKIN EXAM.   GENERAL HEALTH MAINTENANCE: We discussed overall health improvements through diet and exercise. -Encouraged a balanced diet with more green vegetables and salads. -Encouraged regular physical activity, such as walking. -Discussed the potential benefits of Pepsi Zero as an alternative to regular Pepsi. -Recommend drink 64 oz of water daily  -You declined flu and pneumonia vaccinations.                      Contains text generated by [employer].                                 Contains text generated by [employer].

## 2024-08-19 NOTE — Assessment & Plan Note (Addendum)
  Orders:   Comprehensive metabolic panel with GFR   CBC with Differential/Platelet

## 2024-08-19 NOTE — Assessment & Plan Note (Addendum)
  Orders:   POCT glycosylated hemoglobin (Hb A1C)

## 2024-08-19 NOTE — Progress Notes (Unsigned)
 Subjective:  Patient ID: Lisa Ortega, female    DOB: 07/31/1946  Age: 78 y.o. MRN: [id number]  Chief Complaint  Patient presents with   Medical Management of Chronic Issues    HPI: Discussed the use of AI scribe software for clinical note transcription with the patient, who gave verbal consent to proceed.  History of Present Illness Lisa Ortega is a 78 year old female who presents for a routine follow-up visit.  Musculoskeletal pain - Persistent back and leg pain. - Previous x-rays performed for evaluation. - No history of falls.  Constitutional and systemic symptoms - No fevers, chills, sweats, dizziness, headaches, or depression.  Gastrointestinal and genitourinary symptoms - No earaches, sore throat, stuffiness, chest pain, abdominal pain, nausea, vomiting, diarrhea, constipation, or bladder issues.  Urologic procedures and symptoms - Underwent cystoscopy, ureteroscopy, and stent placement for kidney stone in August. - Stent caused discomfort, described as similar to a bladder infection.  Physical activity - Previously walked with her daughter at a gym. - No recent physical activity.  Alopecia - History of hair loss. - Took two medications for hair loss without perceived improvement. - Previously evaluated by dermatologist.  Hyperlipidemia:  Patient is currently taking Rosuvastatin  5 mg take 1 tablet daily.    Hypertension with CKD: Patient is currently taking Atenolol  50 mg take 1 tablet twice daily, Losartan  25 mg take 1 tablet daily, Lasix  20 mg daily   Hypokalemia: Taking potassium chloride 20 meq 1/2 daily. Patient sees  Dr. Katheleen, nephrologist at [facility] annually.    Prediabetes: Last A1c: 5.8 Does not eat healthy or exercise.       08/19/2024    9:37 AM 04/19/2024    9:24 AM 01/25/2024    2:13 PM 10/20/2023   10:22 AM 12/20/2022    1:35 PM  Depression screen PHQ 2/9  Decreased Interest 0 0 0 0 0  Down, Depressed, Hopeless 0 0 0 0 0   PHQ - 2 Score 0 0 0 0 0  Altered sleeping 0 0  0   Tired, decreased energy 0 0  0   Change in appetite 0 0  0   Feeling bad or failure about yourself  0 0  0   Trouble concentrating 0 0  0   Moving slowly or fidgety/restless 0 0  0   Suicidal thoughts 0 0  0   PHQ-9 Score 0 0   0    Difficult doing work/chores Not difficult at all Not difficult at all  Not difficult at all      Data saved with a previous flowsheet row definition        08/19/2024    9:36 AM  Fall Risk   Falls in the past year? 0  Number falls in past yr: 0  Injury with Fall? 0  Risk for fall due to : No Fall Risks  Follow up Falls evaluation completed    Patient Care Team: Sherre Clapper, MD as PCP - General (Family Medicine) Dolan Mateo Larger, MD as Consulting Physician (Nephrology) Erasmo Bernardino BRAVO, OD (Optometry)   Review of Systems  Constitutional:  Negative for chills, fatigue and fever.  HENT:  Negative for congestion, ear pain and sore throat.   Respiratory:  Negative for cough and shortness of breath.   Cardiovascular:  Negative for chest pain.  Gastrointestinal:  Negative for abdominal pain, constipation, diarrhea, nausea and vomiting.  Genitourinary:  Negative for dysuria and urgency.  Musculoskeletal:  Negative for  arthralgias and myalgias.  Skin:  Negative for rash.  Neurological:  Negative for dizziness and headaches.  Psychiatric/Behavioral:  Negative for dysphoric mood. The patient is not nervous/anxious.     Current Outpatient Medications on File Prior to Visit  Medication Sig Dispense Refill   Cholecalciferol (VITAMIN D3) 50 MCG (2000 UT) capsule Take 2,000 Units by mouth daily.     Cyanocobalamin  (VITAMIN B 12 PO) Take 2,500 mcg by mouth daily.     furosemide  (LASIX ) 20 MG tablet Take 1 tablet by mouth once daily 90 tablet 1   KLOR-CON M20 20 MEQ tablet Take 10 mEq by mouth daily.     Multiple Vitamins-Minerals (LUTEIN-ZEAXANTHIN PO) Take 1 tablet by mouth daily.     Hyoscyamine   Sulfate SL (LEVSIN /SL) 0.125 MG SUBL Place 1 tablet (0.125 mg total) under the tongue every 6 (six) hours as needed (bladder spasms/stent pain). (Patient not taking: Reported on 08/19/2024) 30 tablet 0   No current facility-administered medications on file prior to visit.   Past Medical History:  Diagnosis Date   Acute on chronic diastolic (congestive) heart failure (HCC)    Arthritis    Dyspnea    Essential (primary) hypertension    History of kidney stones    Hypertensive chronic kidney disease with stage 1 through stage 4 chronic kidney disease, or unspecified chronic kidney disease    Localized edema    Memory loss 03/03/2021   Mixed hyperlipidemia    Morbid (severe) obesity due to excess calories (HCC)    Obesity, Class III, BMI 40-49.9 (morbid obesity) (HCC) 11/23/2019   Other seasonal allergic rhinitis    Other specified nonscarring hair loss    Pain in left knee 01/21/2020   Pre-diabetes    Sleep apnea    Telogen effluvium    Past Surgical History:  Procedure Laterality Date   CHOLECYSTECTOMY  04/01/2022   Dr Margert   CYSTOSCOPY W/ URETERAL STENT PLACEMENT Left 04/01/2024   Procedure: CYSTOSCOPY, WITH RETROGRADE PYELOGRAM AND URETERAL STENT INSERTION;  Surgeon: Lovie Arlyss CROME, MD;  Location: [facility];  Service: Urology;  Laterality: Left;   CYSTOSCOPY/URETEROSCOPY/HOLMIUM LASER/STENT PLACEMENT Left 04/23/2024   Procedure: CYSTOSCOPY/URETEROSCOPY/HOLMIUM LASER/STENT PLACEMENT;  Surgeon: Lovie Arlyss CROME, MD;  Location: [facility];  Service: Urology;  Laterality: Left;   TONSILLECTOMY      Family History  Problem Relation Age of Onset   Transient ischemic attack Mother    Alzheimer's disease Mother    Stroke Father    Throat cancer Father    Breast cancer Sister 42   Pulmonary embolism Brother    Breast cancer Niece 74 - 71   Asthma Other    Diabetes type II Other    Hypertension Other    Hypothyroidism Other    Heart attack Other    Migraines Other     Osteoarthritis Other    Social History   Socioeconomic History   Marital status: Divorced    Spouse name: Not on file   Number of children: 3   Years of education: Not on file   Highest education level: Not on file  Occupational History   Occupation: Sports Coach  Tobacco Use   Smoking status: Never   Smokeless tobacco: Never  Vaping Use   Vaping status: Never Used  Substance and Sexual Activity   Alcohol use: Never   Drug use: Never   Sexual activity: Not Currently    Birth control/protection: Post-menopausal  Other Topics Concern   Not on file  Social  History Narrative   3 daughters, 5 grandchildren, 5 great-grandchildren   Right handed   One story home, couple of steps at door   Drinks caffeine   Social Drivers of Health   Financial Resource Strain: Low Risk  (01/25/2024)   Overall Financial Resource Strain (CARDIA)    Difficulty of Paying Living Expenses: Not hard at all  Food Insecurity: No Food Insecurity (01/25/2024)   Hunger Vital Sign    Worried About Running Out of Food in the Last Year: Never true    Ran Out of Food in the Last Year: Never true  Transportation Needs: No Transportation Needs (01/25/2024)   PRAPARE - Administrator, Civil Service (Medical): No    Lack of Transportation (Non-Medical): No  Physical Activity: Insufficiently Active (01/25/2024)   Exercise Vital Sign    Days of Exercise per Week: 3 days    Minutes of Exercise per Session: 30 min  Stress: No Stress Concern Present (01/25/2024)   [employer] of Occupational Health - Occupational Stress Questionnaire    Feeling of Stress : Not at all  Social Connections: Socially Isolated (01/25/2024)   Social Connection and Isolation Panel    Frequency of Communication with Friends and Family: Three times a week    Frequency of Social Gatherings with Friends and Family: Three times a week    Attends Religious Services: Never    Active Member of Clubs or Organizations: No    Attends  Banker Meetings: Never    Marital Status: Divorced    Objective:  BP 118/86 (BP Location: Right Arm, Patient Position: Sitting)   Pulse 75   Temp 97.8 F (36.6 C) (Temporal)   Ht 5' 2 (1.575 m)   Wt 222 lb (100.7 kg)   SpO2 98%   BMI 40.60 kg/m      08/19/2024    9:37 AM 04/23/2024   12:20 PM 04/23/2024   12:14 PM  BP/Weight  Systolic BP 118 172 181  Diastolic BP 86 64 65  Wt. (Lbs) 222    BMI 40.6 kg/m2      Physical Exam Vitals reviewed.  Constitutional:      Appearance: Normal appearance. She is obese.  Neck:     Vascular: No carotid bruit.  Cardiovascular:     Rate and Rhythm: Normal rate and regular rhythm.     Heart sounds: Normal heart sounds.  Pulmonary:     Effort: Pulmonary effort is normal. No respiratory distress.     Breath sounds: Normal breath sounds.  Abdominal:     General: Abdomen is flat. Bowel sounds are normal.     Palpations: Abdomen is soft.     Tenderness: There is no abdominal tenderness.  Neurological:     Mental Status: She is alert and oriented to person, place, and time.  Psychiatric:        Mood and Affect: Mood normal.        Behavior: Behavior normal.         Lab Results  Component Value Date   WBC 9.7 08/19/2024   HGB 12.5 08/19/2024   HCT 39.6 08/19/2024   PLT 215 08/19/2024   GLUCOSE 134 (H) 08/19/2024   CHOL 125 08/19/2024   TRIG 61 08/19/2024   HDL 55 08/19/2024   LDLCALC 57 08/19/2024   ALT 9 08/19/2024   AST 18 08/19/2024   NA 146 (H) 08/19/2024   K 5.1 08/19/2024   CL 104 08/19/2024   CREATININE 1.44 (  H) 08/19/2024   BUN 29 (H) 08/19/2024   CO2 27 08/19/2024   TSH 3.290 05/26/2022   HGBA1C 6.1 08/19/2024    Results for orders placed or performed in visit on 08/19/24  Comprehensive metabolic panel with GFR   Collection Time: 08/19/24 10:17 AM  Result Value Ref Range   Glucose 134 (H) 70 - 99 mg/dL   BUN 29 (H) 8 - 27 mg/dL   Creatinine, Ser 8.55 (H) 0.57 - 1.00 mg/dL   eGFR 37 (L)  >40 fO/fpw/8.26   BUN/Creatinine Ratio 20 12 - 28   Sodium 146 (H) 134 - 144 mmol/L   Potassium 5.1 3.5 - 5.2 mmol/L   Chloride 104 96 - 106 mmol/L   CO2 27 20 - 29 mmol/L   Calcium  9.6 8.7 - 10.3 mg/dL   Total Protein 7.4 6.0 - 8.5 g/dL   Albumin 4.2 3.8 - 4.8 g/dL   Globulin, Total 3.2 1.5 - 4.5 g/dL   Bilirubin Total 0.6 0.0 - 1.2 mg/dL   Alkaline Phosphatase 103 49 - 135 IU/L   AST 18 0 - 40 IU/L   ALT 9 0 - 32 IU/L  Lipid panel   Collection Time: 08/19/24 10:17 AM  Result Value Ref Range   Cholesterol, Total 125 100 - 199 mg/dL   Triglycerides 61 0 - 149 mg/dL   HDL 55 >60 mg/dL   VLDL Cholesterol Cal 13 5 - 40 mg/dL   LDL Chol Calc (NIH) 57 0 - 99 mg/dL   Chol/HDL Ratio 2.3 0.0 - 4.4 ratio  CBC with Differential/Platelet   Collection Time: 08/19/24 10:17 AM  Result Value Ref Range   WBC 9.7 3.4 - 10.8 x10E3/uL   RBC 4.33 3.77 - 5.28 x10E6/uL   Hemoglobin 12.5 11.1 - 15.9 g/dL   Hematocrit 60.3 65.9 - 46.6 %   MCV 92 79 - 97 fL   MCH 28.9 26.6 - 33.0 pg   MCHC 31.6 31.5 - 35.7 g/dL   RDW 87.5 88.2 - 84.5 %   Platelets 215 150 - 450 x10E3/uL   Neutrophils 70 Not Estab. %   Lymphs 22 Not Estab. %   Monocytes 7 Not Estab. %   Eos 1 Not Estab. %   Basos 0 Not Estab. %   Neutrophils Absolute 6.8 1.4 - 7.0 x10E3/uL   Lymphocytes Absolute 2.1 0.7 - 3.1 x10E3/uL   Monocytes Absolute 0.7 0.1 - 0.9 x10E3/uL   EOS (ABSOLUTE) 0.1 0.0 - 0.4 x10E3/uL   Basophils Absolute 0.0 0.0 - 0.2 x10E3/uL   Immature Granulocytes 0 Not Estab. %   Immature Grans (Abs) 0.0 0.0 - 0.1 x10E3/uL  POCT glycosylated hemoglobin (Hb A1C)   Collection Time: 08/19/24 12:01 PM  Result Value Ref Range   Hemoglobin A1C     HbA1c POC (<> result, manual entry) 6.1 4.0 - 5.6 %   HbA1c, POC (prediabetic range)     HbA1c, POC (controlled diabetic range)    .  Assessment & Plan:   Assessment & Plan Impaired fasting glucose Prediabetes with recent A1c of 6.1, slightly elevated from previous 5.8.  Dietary habits may contribute to elevated glucose levels. - Encouraged dietary modifications to reduce sugar intake, such as reducing banana consumption. - Encouraged increased intake of green vegetables and salads. - Encouraged regular physical activity, such as walking. Declined flu and pneumonia vaccinations. Discussed dietary and exercise modifications for overall health improvement. - Encouraged balanced diet with increased intake of green vegetables and salads. - Encouraged regular  physical activity, such as walking. - Discussed potential benefits of Pepsi Zero as an alternative to regular Pepsi.  Orders:   POCT glycosylated hemoglobin (Hb A1C)  Mixed hyperlipidemia Managed with rosuvastatin  5 mg once daily. - Continue rosuvastatin  5 mg once daily. Orders:   Lipid panel   rosuvastatin  (CRESTOR ) 5 MG tablet; Take 1 tablet (5 mg total) by mouth daily.  Hypertensive kidney disease with stage 3b chronic kidney disease (HCC) Hypertensive chronic kidney disease Chronic kidney disease with hypertension, well-controlled with losartan , atenolol , and Lasix . Regular monitoring of kidney function is necessary. - Checked kidney function today. - Continue losartan  25 mg once daily. - Continue atenolol  50 mg twice daily. - Continue Lasix  20 mg once daily. Orders:   Comprehensive metabolic panel with GFR   CBC with Differential/Platelet  Simple chronic bronchitis (HCC) No medicines at this time.     Alopecia Previous dermatological evaluation. Daughter suggests further dermatological evaluation for potential treatment options. - Referred to dermatologist for further evaluation and management previously. Recommend patient call them back to schedule a follow up appointment.  - I also recommend they do a full skin exam.       Body mass index is 40.6 kg/m.   Meds ordered this encounter  Medications   atenolol  (TENORMIN ) 50 MG tablet    Sig: Take 1 tablet (50 mg total) by mouth 2  (two) times daily.    Dispense:  180 tablet    Refill:  1   losartan  (COZAAR ) 25 MG tablet    Sig: Take 1 tablet (25 mg total) by mouth daily.    Dispense:  90 tablet    Refill:  1   rosuvastatin  (CRESTOR ) 5 MG tablet    Sig: Take 1 tablet (5 mg total) by mouth daily.    Dispense:  90 tablet    Refill:  1    Orders Placed This Encounter  Procedures   Comprehensive metabolic panel with GFR   Lipid panel   CBC with Differential/Platelet   POCT glycosylated hemoglobin (Hb A1C)       Follow-up: Return in about 5 months (around 01/30/2025) for awv already scheduled/add chronic visit. SABRA  An After Visit Summary was printed and given to the patient.  Abigail Free, MD [facility] [phone]

## 2024-08-20 ENCOUNTER — Ambulatory Visit: Payer: Self-pay | Admitting: Family Medicine

## 2024-08-20 DIAGNOSIS — J410 Simple chronic bronchitis: Secondary | ICD-10-CM | POA: Insufficient documentation

## 2024-08-20 DIAGNOSIS — L659 Nonscarring hair loss, unspecified: Secondary | ICD-10-CM | POA: Insufficient documentation

## 2024-08-20 NOTE — Assessment & Plan Note (Signed)
No medicines at this time.

## 2024-08-20 NOTE — Assessment & Plan Note (Signed)
 Previous dermatological evaluation. Daughter suggests further dermatological evaluation for potential treatment options. - Referred to dermatologist for further evaluation and management previously. Recommend patient call them back to schedule a follow up appointment.  - I also recommend they do a full skin exam.

## 2024-09-02 DIAGNOSIS — H52223 Regular astigmatism, bilateral: Secondary | ICD-10-CM | POA: Diagnosis not present

## 2024-10-03 ENCOUNTER — Other Ambulatory Visit: Payer: Self-pay | Admitting: Family Medicine

## 2024-10-03 DIAGNOSIS — I5032 Chronic diastolic (congestive) heart failure: Secondary | ICD-10-CM

## 2025-01-30 ENCOUNTER — Ambulatory Visit

## 2025-02-17 ENCOUNTER — Ambulatory Visit: Admitting: Family Medicine
